# Patient Record
Sex: Female | Born: 1960 | Race: White | Hispanic: No | Marital: Single | Smoking: Former smoker
Health system: Southern US, Community
[De-identification: ages and names within clinical notes are randomized; demographics above are authoritative.]

## PROBLEM LIST (undated history)

## (undated) ENCOUNTER — Inpatient Hospital Stay (HOSPITAL_COMMUNITY)
Admission: RE | Payer: Medicaid Other | Source: Ambulatory Visit | Attending: Physician Assistant | Admitting: Physician Assistant

## (undated) DIAGNOSIS — F430 Acute stress reaction: Secondary | ICD-10-CM

## (undated) DIAGNOSIS — E039 Hypothyroidism, unspecified: Secondary | ICD-10-CM

## (undated) DIAGNOSIS — M5136 Other intervertebral disc degeneration, lumbar region: Secondary | ICD-10-CM

## (undated) DIAGNOSIS — K5900 Constipation, unspecified: Secondary | ICD-10-CM

## (undated) DIAGNOSIS — K760 Fatty (change of) liver, not elsewhere classified: Secondary | ICD-10-CM

## (undated) DIAGNOSIS — F4310 Post-traumatic stress disorder, unspecified: Secondary | ICD-10-CM

## (undated) DIAGNOSIS — K219 Gastro-esophageal reflux disease without esophagitis: Secondary | ICD-10-CM

## (undated) DIAGNOSIS — F329 Major depressive disorder, single episode, unspecified: Secondary | ICD-10-CM

## (undated) DIAGNOSIS — E079 Disorder of thyroid, unspecified: Secondary | ICD-10-CM

## (undated) DIAGNOSIS — F32A Depression, unspecified: Secondary | ICD-10-CM

## (undated) DIAGNOSIS — F209 Schizophrenia, unspecified: Secondary | ICD-10-CM

## (undated) DIAGNOSIS — F319 Bipolar disorder, unspecified: Secondary | ICD-10-CM

## (undated) DIAGNOSIS — G629 Polyneuropathy, unspecified: Secondary | ICD-10-CM

## (undated) DIAGNOSIS — F410 Panic disorder [episodic paroxysmal anxiety] without agoraphobia: Secondary | ICD-10-CM

## (undated) HISTORY — PX: COLONOSCOPY: SHX174

## (undated) HISTORY — PX: ESOPHAGOGASTRODUODENOSCOPY: SHX1529

## (undated) HISTORY — PX: SKIN CANCER EXCISION: SHX779

## (undated) HISTORY — PX: OTHER SURGICAL HISTORY: SHX169

## (undated) HISTORY — PX: MULTIPLE TOOTH EXTRACTIONS: SHX2053

---

## 1987-03-19 HISTORY — PX: AUGMENTATION MAMMAPLASTY: SUR837

## 2016-03-19 ENCOUNTER — Emergency Department (HOSPITAL_COMMUNITY)
Admission: EM | Admit: 2016-03-19 | Discharge: 2016-03-19 | Disposition: A | Discharge status: Home / Self Care | Payer: Medicaid Other | Attending: Emergency Medicine | Admitting: Emergency Medicine

## 2016-03-19 ENCOUNTER — Encounter (HOSPITAL_COMMUNITY): Payer: Self-pay

## 2016-03-19 ENCOUNTER — Emergency Department (HOSPITAL_COMMUNITY): Payer: Medicaid Other

## 2016-03-19 DIAGNOSIS — F17210 Nicotine dependence, cigarettes, uncomplicated: Secondary | ICD-10-CM | POA: Insufficient documentation

## 2016-03-19 DIAGNOSIS — M542 Cervicalgia: Secondary | ICD-10-CM | POA: Insufficient documentation

## 2016-03-19 DIAGNOSIS — M546 Pain in thoracic spine: Secondary | ICD-10-CM | POA: Diagnosis not present

## 2016-03-19 DIAGNOSIS — M549 Dorsalgia, unspecified: Secondary | ICD-10-CM

## 2016-03-19 HISTORY — DX: Disorder of thyroid, unspecified: E07.9

## 2016-03-19 HISTORY — DX: Depression, unspecified: F32.A

## 2016-03-19 HISTORY — DX: Major depressive disorder, single episode, unspecified: F32.9

## 2016-03-19 MED ORDER — OXYCODONE-ACETAMINOPHEN 5-325 MG PO TABS: 1.0000 | ORAL_TABLET | ORAL | 0 refills | Status: DC | PRN

## 2016-03-19 NOTE — ED Notes (Signed)
Patient complaining of feeling agitated, stating she has a panic disorder and having to wait in her small room is making her more agitated.  Patient given ice chips per her request and and therapeutic discussion of feelings, patient redirected and cooperative while still agitated at this time.

## 2016-03-19 NOTE — ED Notes (Signed)
Patient and visitors asking multiple questions regarding wait time and feeling anxious.  Trenton Founds, RN reassured patient and encouraged her to relax until ready for discharge.

## 2016-03-19 NOTE — Discharge Instructions (Signed)
Please take Percocet every 6 hours as needed for pain. Use warm compress for 15 minutes 3-5 times a day.  Get help right away if: You have shortness of breath. You have chest pain. You develop numbness or weakness in your legs. You have involuntary loss of urine (urinary incontinence).

## 2016-03-19 NOTE — ED Notes (Signed)
Patient given cup of water per request.

## 2016-03-19 NOTE — ED Notes (Signed)
Patient ambulated to restroom independently without difficulty. 

## 2016-03-19 NOTE — ED Notes (Signed)
Patient sitting in treatment room laughing at television with friend.

## 2016-03-19 NOTE — ED Provider Notes (Signed)
[facility] DEPT Provider Note   CSN: [id number] Arrival date & time: 03/19/16  1107  By signing my name below, I, Marilyn Hurley, attest that this documentation has been prepared under the direction and in the presence of Heath Lark, PA-C Electronically Signed: Soijett Hurley, ED Scribe. 03/19/16. 2:33 PM.  History   Chief Complaint Chief Complaint  Patient presents with  . Neck Pain    HPI Marilyn Hurley is a 56 y.o. female who presents to the Emergency Department complaining of 7.5/10 neck pain onset 2 days ago. Pt neck pain radiates to her right arm and bilateral upper back, right > left. Pt neck pain is worsened with movement. Pt is having associated symptoms of intermittent tingling to right arm. She has not tried any medications for the relief of her symptoms. She denies neck stiffness, HA, photophobia, right shoulder pain, color change, wound, gait problem, CP, SOB, fever, chills, abdominal pain, nausea, vomiting, diarrhea, constipation, dysuria, frequency, history of cancer, IVDU, and any other symptoms. Denies recent trauma or injury. Pt notes that she recently moved to [place] 1 month ago and she traveled via plane.   The history is provided by the patient. No language interpreter was used.    Past Medical History:  Diagnosis Date  . Depression   . Thyroid disease     There are no active problems to display for this patient.   Past Surgical History:  Procedure Laterality Date  . breast implant    . CESAREAN SECTION      OB History    No data available       Home Medications    Prior to Admission medications   Medication Sig Start Date End Date Taking? Authorizing Provider  oxyCODONE-acetaminophen (PERCOCET/ROXICET) 5-325 MG tablet Take 1-2 tablets by mouth every 4 (four) hours as needed for severe pain. 03/19/16   Jancarlos Thrun Mathews [place], [place]    Family History No family history on file.  Social History Social History  Substance Use Topics  . Smoking status:  Current Some Day Smoker    Types: Cigarettes  . Smokeless tobacco: Never Used  . Alcohol use No     Allergies   Ibuprofen and Keflex [cephalexin]   Review of Systems Review of Systems  Constitutional: Negative for chills and fever.  Eyes: Negative for photophobia.  Respiratory: Negative for shortness of breath.   Cardiovascular: Negative for chest pain.  Gastrointestinal: Negative for abdominal pain, diarrhea and vomiting.  Genitourinary: Negative for dysuria and frequency.  Musculoskeletal: Positive for neck pain (right sided). Negative for arthralgias and neck stiffness.  Skin: Negative for color change and wound.  Neurological: Negative for headaches.       Tingling to right arm    Physical Exam Updated Vital Signs BP 113/77 (BP Location: Left Arm)   Pulse 76   Temp 98.5 F (36.9 C) (Oral)   Resp 20   Ht 5\' 6"  (1.676 m)   Wt 84.8 kg   SpO2 98%   BMI 30.18 kg/m   Physical Exam  Constitutional: She is oriented to person, place, and time. She appears well-developed and well-nourished. No distress.  HENT:  Head: Normocephalic and atraumatic.  Eyes: EOM are normal. Pupils are equal, round, and reactive to light.  Neck: Normal range of motion. Neck supple.  Cardiovascular: Normal rate, regular rhythm, normal heart sounds and intact distal pulses.  Exam reveals no gallop and no friction rub.   No murmur heard. Pulmonary/Chest: Effort normal and breath sounds normal. No  respiratory distress. She has no wheezes. She has no rales.  Abdominal: Soft. She exhibits no distension and no pulsatile midline mass. There is no tenderness. There is no tenderness at McBurney's point and negative Murphy's sign.  Abdomen soft and non-tender. No pulsatile mass. No focal tenderness to McBurney's point. Negative Murphy's sign.   Musculoskeletal: Normal range of motion.       Cervical back: She exhibits tenderness, bony tenderness and edema.  Midline cervical and upper thoracic spine and  paraspinous muscles with TTP. Edema noted to midline lower cervical spine without erythema. No lumbar spinal or paraspinal tenderness. Full ROM of spine, with slight pain on twisting to right side.   Neurological: She is alert and oriented to person, place, and time. No sensory deficit. Coordination normal.  Cranial Nerves:  III,IV, VI: ptosis not present, extra-ocular movements intact bilaterally, direct and consensual pupillary light reflexes intact bilaterally V: facial sensation, jaw opening, and bite strength equal bilaterally VII: eyebrow raise, eyelid close, smile, frown, pucker equal bilaterally VIII: hearing grossly normal bilaterally  IX,X: palate elevation and swallowing intact XI: bilateral shoulder shrug and lateral head rotation equal and strong XII: midline tongue extension  Able to ambulate without difficulty.   Skin: Skin is warm and dry.  Psychiatric: She has a normal mood and affect. Her behavior is normal.  Nursing note and vitals reviewed.  ED Treatments / Results  DIAGNOSTIC STUDIES: Oxygen Saturation is 99% on RA, nl by my interpretation.    COORDINATION OF CARE: 2:24 PM Discussed treatment plan with pt at bedside which includes cervical spine xray, thoracic spine xray, and pt agreed to plan.   Radiology Dg Cervical Spine Complete  Result Date: 03/19/2016 CLINICAL DATA:  Upper back pain neck pain EXAM: CERVICAL SPINE - COMPLETE 4+ VIEW COMPARISON:  None. FINDINGS: Normal cervical alignment. Cervical kyphosis. Negative for fracture or mass. Mild disc degeneration and spurring at C5-6. No significant foraminal encroachment. Prevertebral soft tissues normal. IMPRESSION: Mild disc degeneration and spurring C5-6. Electronically Signed   By: Franchot Gallo M.D.   On: 03/19/2016 15:21   Dg Thoracic Spine 2 View  Result Date: 03/19/2016 CLINICAL DATA:  Upper back pain EXAM: THORACIC SPINE 2 VIEWS COMPARISON:  None. FINDINGS: Negative for fracture or mass. Normal alignment.  Mild thoracic disc degeneration at multiple levels. IMPRESSION: No acute abnormality. Electronically Signed   By: Franchot Gallo M.D.   On: 03/19/2016 15:35    Procedures Procedures (including critical care time)  Medications Ordered in ED Medications - No data to display   Initial Impression / Assessment and Plan / ED Course  I have reviewed the triage vital signs and the nursing notes.  Pertinent imaging results that were available during my care of the patient were reviewed by me and considered in my medical decision making (see chart for details).  Clinical Course   Patient is a 56 y.o. female who presents to the ED with upper back pain. No neurological deficits appreciated. Patient is ambulatory. No warning symptoms of back pain including: fecal incontinence, urinary retention or overflow incontinence, night sweats, waking from sleep with back pain, unexplained fevers or weight loss, h/o cancer, IVDU, recent trauma. No concern for cauda equina, epidural abscess, or other serious cause of back pain. Cervical spine xray shows mild degenerative disc changes and spurring at C5-6. Conservative measures such as rest, ice/heat and pain medicine indicated with PCP follow-up if no improvement with conservative management. Return precautions given for any new or worsening symptoms.  Final Clinical Impressions(s) / ED Diagnoses   Final diagnoses:  Upper back pain    New Prescriptions Discharge Medication List as of 03/19/2016  3:58 PM    START taking these medications   Details  oxyCODONE-acetaminophen (PERCOCET/ROXICET) 5-325 MG tablet Take 1-2 tablets by mouth every 4 (four) hours as needed for severe pain., Starting Tue 03/19/2016, Print       I personally performed the services described in this documentation, which was scribed in my presence. The recorded information has been reviewed and is accurate.    [place], [place] 03/19/16 1821    Veryl Speak, MD 03/19/16  2027

## 2016-03-19 NOTE — ED Triage Notes (Signed)
Pt presents for evaluation of neck pain with radiation down back x 2 days. Pt denies hx of neck/back problems. Pt ambulatory, denies injury to area. Pt AxO x4.

## 2016-03-19 NOTE — ED Notes (Signed)
Patient insists on leaving door open, currently has TV turned up loud, laughing and watching TV with visitors.  No needs voiced at this time.

## 2016-06-10 ENCOUNTER — Encounter (INDEPENDENT_AMBULATORY_CARE_PROVIDER_SITE_OTHER): Payer: Self-pay | Admitting: Physician Assistant

## 2016-06-10 ENCOUNTER — Ambulatory Visit (INDEPENDENT_AMBULATORY_CARE_PROVIDER_SITE_OTHER): Payer: Medicaid Other | Admitting: Physician Assistant

## 2016-06-10 VITALS — BP 126/77 | HR 77 | Temp 97.7°F | Ht 66.0 in | Wt 194.8 lb

## 2016-06-10 DIAGNOSIS — E039 Hypothyroidism, unspecified: Secondary | ICD-10-CM

## 2016-06-10 DIAGNOSIS — F418 Other specified anxiety disorders: Secondary | ICD-10-CM

## 2016-06-10 DIAGNOSIS — N899 Noninflammatory disorder of vagina, unspecified: Secondary | ICD-10-CM | POA: Diagnosis not present

## 2016-06-10 DIAGNOSIS — D179 Benign lipomatous neoplasm, unspecified: Secondary | ICD-10-CM | POA: Diagnosis not present

## 2016-06-10 DIAGNOSIS — Z760 Encounter for issue of repeat prescription: Secondary | ICD-10-CM

## 2016-06-10 DIAGNOSIS — M5020 Other cervical disc displacement, unspecified cervical region: Secondary | ICD-10-CM

## 2016-06-10 DIAGNOSIS — N898 Other specified noninflammatory disorders of vagina: Secondary | ICD-10-CM

## 2016-06-10 DIAGNOSIS — K5900 Constipation, unspecified: Secondary | ICD-10-CM

## 2016-06-10 MED ORDER — OMEPRAZOLE 20 MG PO CPDR: 20.0000 mg | DELAYED_RELEASE_CAPSULE | Freq: Every day | ORAL | 3 refills | Status: DC

## 2016-06-10 MED ORDER — ESCITALOPRAM OXALATE 10 MG PO TABS: 10.0000 mg | ORAL_TABLET | Freq: Every day | ORAL | 1 refills | Status: DC

## 2016-06-10 MED ORDER — SERTRALINE HCL 50 MG PO TABS: 50.0000 mg | ORAL_TABLET | Freq: Every day | ORAL | 2 refills | Status: DC

## 2016-06-10 MED ORDER — CLONAZEPAM 0.5 MG PO TABS: 0.5000 mg | ORAL_TABLET | Freq: Every day | ORAL | 0 refills | Status: DC

## 2016-06-10 MED ORDER — LEVOTHYROXINE SODIUM 150 MCG PO TABS: 150.0000 ug | ORAL_TABLET | Freq: Every day | ORAL | 3 refills | Status: AC

## 2016-06-10 MED ORDER — LEVOTHYROXINE SODIUM 150 MCG PO TABS: 150.0000 ug | ORAL_TABLET | Freq: Every day | ORAL | 3 refills | Status: DC

## 2016-06-10 NOTE — Progress Notes (Signed)
Subjective:  Patient ID: Marilyn Hurley, female    DOB: [birth year]  Age: 56 y.o. MRN: [id number]  CC:  Anxiety and multiple complaints  HPI Marilyn Hurley is a 56 y.o. female with a PMH of anxiety and hypothyroidism complains about depression and anxiety. Out of meds for approximately 2-3 weeks. Has not been able to establish psychiatric help here yet. Saw psychiatry in [place] for about 3 months in total. She feels uncomfortable around crowds, likes to stay home in her "safe place". Has trouble sleeping and was taking trazodone. Denies current suicidal ideation/intent or homicidal ideation/intent. She is also out of thyroid medication for 2 weeks. Was taking Synthroid 150 mcg but is now taking some left over 137 mcg. Has some epigastric pain that is relieved with prilosec OTC. Says she has a lump in the epigastrium (points to xiphoid process) that is concerning her. Said a previous doctor (described as the stupidest doctor in the world) told her the lump was part of her ribs. She is also concerned about a lump in the left flank. Lump was previously diagnosed as a lipoma. Lump is stable and non tender. Also shares that she has a lump that is located in the superior wall of the anterior vagina that makes it difficult for her to have sex. Would like a GYN referral. Lastly, says that she feels constipated and is having a bowel movement approximately every two days. Endorses no other symptoms.   Outpatient Medications Prior to Visit  Medication Sig Dispense Refill  . oxyCODONE-acetaminophen (PERCOCET/ROXICET) 5-325 MG tablet Take 1-2 tablets by mouth every 4 (four) hours as needed for severe pain. 10 tablet 0   No facility-administered medications prior to visit.      ROS Review of Systems  Constitutional: Negative for chills and fever.  Respiratory: Negative for shortness of breath.   Cardiovascular: Negative for chest pain and palpitations.  Gastrointestinal: Positive for abdominal pain  (epigastrium relieved with prilosec), constipation and heartburn. Negative for diarrhea, nausea and vomiting.  Skin: Negative for rash.  Neurological: Negative for headaches.  Psychiatric/Behavioral: Positive for depression. Negative for suicidal ideas. The patient is nervous/anxious and has insomnia.     Objective:  BP 126/77 (BP Location: Left Arm, Patient Position: Sitting, Cuff Size: Normal)   Pulse 77   Temp 97.7 F (36.5 C) (Oral)   Ht 5\' 6"  (1.676 m)   Wt 194 lb 12.8 oz (88.4 kg)   SpO2 97%   BMI 31.44 kg/m   BP/Weight 1/96/2229 09/24/8919  Systolic BP 194 174  Diastolic BP 77 77  Wt. (Lbs) 194.8 187  BMI 31.44 30.18      Physical Exam  Constitutional: She is oriented to person, place, and time.  Well developed, overweight, NAD, polite  HENT:  Head: Normocephalic and atraumatic.  Eyes: Pupils are equal, round, and reactive to light. No scleral icterus.  Neck: Normal range of motion. Neck supple. No thyromegaly present.  Cardiovascular: Normal rate, regular rhythm and normal heart sounds.   Pulmonary/Chest: Effort normal and breath sounds normal.  Abdominal: Soft. Bowel sounds are normal. She exhibits no distension and no mass. There is no tenderness. There is no rebound and no guarding.  Musculoskeletal: She exhibits no edema.  Lymphadenopathy:    She has no cervical adenopathy.  Neurological: She is alert and oriented to person, place, and time. A cranial nerve deficit is present. Coordination normal.  Skin: Skin is warm and dry. No rash noted. She is not diaphoretic. No erythema. No pallor.  Psychiatric: Her behavior is normal. Thought content normal.  Moderately severe anxiety  Vitals reviewed.    Assessment & Plan:   1. Depression with anxiety - PHQ9 score 24, GAD7 score 21 - Ambulatory referral to Psychiatry - Thyroid Panel With TSH - clonazePAM (KLONOPIN) 0.5 MG tablet; Take 1 tablet (0.5 mg total) by mouth at bedtime.  Dispense: 30 tablet; Refill: 0 -  escitalopram (LEXAPRO) 10 MG tablet; Take 1 tablet (10 mg total) by mouth daily.  Dispense: 30 tablet; Refill: 1  2. Hypothyroidism, unspecified type - CBC with Differential - Comprehensive metabolic panel - Thyroid Panel With TSH - levothyroxine (SYNTHROID, LEVOTHROID) 150 MCG tablet; Take 1 tablet (150 mcg total) by mouth daily.  Dispense: 90 tablet; Refill: 3  3. Vaginal mass - Suspected cystocele - Ambulatory referral to Gynecology  4. Lipoma, unspecified site - No treatment at this time. Lipoma reported as stable in size for many years and is causing no pain.  5. Constipation, unspecified constipation type - Increase fiber intake - Defecating at normal intervals, e.g. Every two days.  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  - Ambulatory referral to Orthopedic Surgery -Progress note A&P from outside clinic on 02/14/2016 summarizes MRI C4-5 disc herniation. Surgical intervention vs conservative management was discussed at the time. * Rest of the findings as follows:    1. CT C spine w/o contrast taken on 02/11/2016 shows no acute osseous abnormality, mild degenerative changes seen throughout the spine.     2. CT brain w/o contrast on 02/11/2016 shows no acute intracranial abnormalities.    3. MRA brain w/o IV contrast on 02/11/2016 shows unremarkable MRA of the brain.    4.MRI brain w/o contrast on 02/11/2016 shows no acute intracranial abnormalities  7. Medication refill - omeprazole (PRILOSEC) 20 MG capsule; Take 1 capsule (20 mg total) by mouth daily.  Dispense: 30 capsule; Refill: 3   Meds ordered this encounter  Medications  . DISCONTD: levothyroxine (SYNTHROID, LEVOTHROID) 150 MCG tablet    Sig: Take 1 tablet (150 mcg total) by mouth daily.    Dispense:  90 tablet    Refill:  3    Order Specific Question:   Supervising Provider    Answer:   Tresa Garter [id number]  . DISCONTD: omeprazole (PRILOSEC)  20 MG capsule    Sig: Take 1 capsule (20 mg total) by mouth daily.    Dispense:  30 capsule    Refill:  3    Order Specific Question:   Supervising Provider    Answer:   Tresa Garter [id number]  . DISCONTD: sertraline (ZOLOFT) 50 MG tablet    Sig: Take 1 tablet (50 mg total) by mouth daily.    Dispense:  30 tablet    Refill:  2    Order Specific Question:   Supervising Provider    Answer:   Tresa Garter [id number]  . clonazePAM (KLONOPIN) 0.5 MG tablet    Sig: Take 1 tablet (0.5 mg total) by mouth at bedtime.    Dispense:  30 tablet    Refill:  0    Order Specific Question:   Supervising Provider    Answer:   Tresa Garter [id number]  . levothyroxine (SYNTHROID, LEVOTHROID) 150 MCG tablet    Sig: Take 1 tablet (150 mcg total) by mouth daily.    Dispense:  90 tablet    Refill:  3    Order Specific Question:  Supervising Provider    Answer:   Tresa Garter [id number]  . omeprazole (PRILOSEC) 20 MG capsule    Sig: Take 1 capsule (20 mg total) by mouth daily.    Dispense:  30 capsule    Refill:  3    Order Specific Question:   Supervising Provider    Answer:   Tresa Garter [id number]  . DISCONTD: sertraline (ZOLOFT) 50 MG tablet    Sig: Take 1 tablet (50 mg total) by mouth daily.    Dispense:  30 tablet    Refill:  2    Order Specific Question:   Supervising Provider    Answer:   Tresa Garter [id number]  . escitalopram (LEXAPRO) 10 MG tablet    Sig: Take 1 tablet (10 mg total) by mouth daily.    Dispense:  30 tablet    Refill:  1    Order Specific Question:   Supervising Provider    Answer:   Tresa Garter [id number]    Follow-up: Return in about 4 weeks (around 07/08/2016) for anxiety.   Clent Demark PA

## 2016-06-10 NOTE — Patient Instructions (Signed)

## 2016-06-11 LAB — THYROID PANEL WITH TSH
FREE THYROXINE INDEX: 1 — AB (ref 1.2–4.9)
T3 Uptake Ratio: 13 % — ABNORMAL LOW (ref 24–39)
T4, Total: 7.8 ug/dL (ref 4.5–12.0)
TSH: 38.54 u[IU]/mL — ABNORMAL HIGH (ref 0.450–4.500)

## 2016-06-11 LAB — COMPREHENSIVE METABOLIC PANEL
ALBUMIN: 4.6 g/dL (ref 3.5–5.5)
ALT: 47 IU/L — ABNORMAL HIGH (ref 0–32)
AST: 41 IU/L — ABNORMAL HIGH (ref 0–40)
Albumin/Globulin Ratio: 1.2 (ref 1.2–2.2)
Alkaline Phosphatase: 79 IU/L (ref 39–117)
BILIRUBIN TOTAL: 0.3 mg/dL (ref 0.0–1.2)
BUN / CREAT RATIO: 17 (ref 9–23)
BUN: 12 mg/dL (ref 6–24)
CO2: 25 mmol/L (ref 18–29)
CREATININE: 0.72 mg/dL (ref 0.57–1.00)
Calcium: 9.4 mg/dL (ref 8.7–10.2)
Chloride: 101 mmol/L (ref 96–106)
GFR calc non Af Amer: 95 mL/min/{1.73_m2} (ref >59)
GFR, EST AFRICAN AMERICAN: 109 mL/min/{1.73_m2} (ref >59)
GLUCOSE: 84 mg/dL (ref 65–99)
Globulin, Total: 3.8 g/dL (ref 1.5–4.5)
Potassium: 4.7 mmol/L (ref 3.5–5.2)
Sodium: 140 mmol/L (ref 134–144)
TOTAL PROTEIN: 8.4 g/dL (ref 6.0–8.5)

## 2016-06-11 LAB — CBC WITH DIFFERENTIAL/PLATELET
BASOS ABS: 0 10*3/uL (ref 0.0–0.2)
Basos: 1 %
EOS (ABSOLUTE): 0.3 10*3/uL (ref 0.0–0.4)
EOS: 4 %
HEMOGLOBIN: 14.5 g/dL (ref 11.1–15.9)
Hematocrit: 41.8 % (ref 34.0–46.6)
IMMATURE GRANS (ABS): 0 10*3/uL (ref 0.0–0.1)
Immature Granulocytes: 0 %
LYMPHS: 41 %
Lymphocytes Absolute: 3.1 10*3/uL (ref 0.7–3.1)
MCH: 33.2 pg — AB (ref 26.6–33.0)
MCHC: 34.7 g/dL (ref 31.5–35.7)
MCV: 96 fL (ref 79–97)
MONOCYTES: 10 %
Monocytes Absolute: 0.8 10*3/uL (ref 0.1–0.9)
NEUTROS ABS: 3.4 10*3/uL (ref 1.4–7.0)
Neutrophils: 44 %
Platelets: 203 10*3/uL (ref 150–379)
RBC: 4.37 x10E6/uL (ref 3.77–5.28)
RDW: 13.5 % (ref 12.3–15.4)
WBC: 7.7 10*3/uL (ref 3.4–10.8)

## 2016-06-13 ENCOUNTER — Other Ambulatory Visit (INDEPENDENT_AMBULATORY_CARE_PROVIDER_SITE_OTHER): Payer: Self-pay | Admitting: Physician Assistant

## 2016-06-13 DIAGNOSIS — N898 Other specified noninflammatory disorders of vagina: Secondary | ICD-10-CM

## 2016-06-13 NOTE — Progress Notes (Signed)
Referral to GYN made to a different location.

## 2016-06-21 ENCOUNTER — Encounter: Payer: Self-pay | Admitting: Obstetrics & Gynecology

## 2016-06-21 ENCOUNTER — Ambulatory Visit (INDEPENDENT_AMBULATORY_CARE_PROVIDER_SITE_OTHER): Payer: Medicaid Other | Admitting: Obstetrics & Gynecology

## 2016-06-21 VITALS — BP 112/71 | HR 70 | Wt 191.3 lb

## 2016-06-21 DIAGNOSIS — N941 Unspecified dyspareunia: Secondary | ICD-10-CM | POA: Diagnosis present

## 2016-06-21 NOTE — Progress Notes (Signed)
   Subjective:    Patient ID: Marilyn Hurley, female    DOB: 09/03/1960, 56 y.o.   MRN: [id number]  HPI 56 yo engaged lady here because she noted a new sensation ("like a ball") with her finger in her vagina. She also describes dyspareunia for the last 3-4 months. It is not a size issue. She uses lubricants "sometimes".    Review of Systems Her pap is UTD, She sees her FP in 2 weeks and will discuss colonoscopy then. Mammogram UTD    Objective:   Physical Exam Somewhat nervous WFNAD Her vulva and vagina have V V A. She really tightens her introitus when I was examining her with one finger There is almost zero prolapse of any kind NO masses noted She does have several external skin tags.       Assessment & Plan:  dysparenua- rec Astroglide regularly, she declines vaginal estrogen I will be happy to remove her skin tags at her convenience

## 2016-06-21 NOTE — Progress Notes (Signed)
LAST PAP 2016

## 2016-07-08 ENCOUNTER — Ambulatory Visit (INDEPENDENT_AMBULATORY_CARE_PROVIDER_SITE_OTHER): Payer: Self-pay | Admitting: Orthopaedic Surgery

## 2016-07-08 ENCOUNTER — Encounter (INDEPENDENT_AMBULATORY_CARE_PROVIDER_SITE_OTHER): Payer: Self-pay | Admitting: Orthopaedic Surgery

## 2016-07-08 ENCOUNTER — Ambulatory Visit (INDEPENDENT_AMBULATORY_CARE_PROVIDER_SITE_OTHER): Payer: Medicaid Other | Admitting: Orthopaedic Surgery

## 2016-07-08 VITALS — BP 114/78 | HR 66 | Ht 65.0 in | Wt 181.0 lb

## 2016-07-08 DIAGNOSIS — M542 Cervicalgia: Secondary | ICD-10-CM | POA: Diagnosis not present

## 2016-07-09 NOTE — Progress Notes (Signed)
Office Visit Note   Patient: Marilyn Hurley           Date of Birth: 08/19/1960           MRN: [id number] Visit Date: 07/08/2016              Requested by: Clent Demark, PA-C [place], [place] [address] PCP: Clent Demark, PA-C   Assessment & Plan: Visit Diagnoses:  1. Neck pain     Plan: Patient reports severe pain and  had normal cervical  CT scan back in the fall 2017 in [place]. She rates her pain as severe. Will obtain an MRI scan cervical spine to evaluate her C5-6 spondylosis off was follow-up after row scan for review.  Follow-Up Instructions: No Follow-up on file.   Orders:  Orders Placed This Encounter  Procedures  . MR Cervical Spine w/o contrast   No orders of the defined types were placed in this encounter.     Procedures: No procedures performed   Clinical Data: No additional findings.   Subjective: Chief Complaint  Patient presents with  . Neck - Pain    HPI 56 year old female with very complex history. 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 Patient has been taking Percocet 10/325 4 tablets a day and states she has run out. She said plain radiograph series C-spine and T-spine 03/19/2016. CT scan 02/11/2016 in [place] at [facility] showed no acute osseous abnormalities. Plain radiographs and  showed mild disc degeneration spurring at C5-6. Patient complains of pain of pain in the posterior cervical neck and radiates down and to the upper thoracic spine. T-spine x-rays were normal. Patient denies any associated bowel or bladder symptoms.  Review of Systems patient has history of acid reflux anxiety depression sleep apnea thyroid condition. The right sided body paralysis with no evidence of stroke. Neck pain with decreased range of motion with pain that radiates from her neck and her right  shoulder.   Objective: Vital Signs: BP 114/78   Pulse 66   Ht 5\' 5"  (1.651 m)   Wt 181 lb (82.1 kg)   BMI 30.12 kg/m   Physical Exam  Constitutional: She is oriented to person, place, and time. She appears well-developed.  HENT:  Head: Normocephalic.  Right Ear: External ear normal.  Left Ear: External ear normal.  Eyes: Pupils are equal, round, and reactive to light.  Neck: No tracheal deviation present. No thyromegaly present.  Cardiovascular: Normal rate.   Pulmonary/Chest: Effort normal.  Abdominal: Soft.  Musculoskeletal:  Patient has increased pain with cervical compression some improvement with the cervical distraction. She has brachial plexus tenderness in the right positive Spurling on the right knee over many upper extremity reflexes are 2+. No spasticity no atrophy no isolated motor weakness. No lower extremity hyperreflexia. She has pain with palpation of the trapezial muscle on the right than left. Tenderness of the C7-T1 spinous processes. No winging of the scapula serratus anterior is normal. Normal shoulders shrug. Nodes of the ulnar nerve compression of the elbow median nerve forearm. No shoulder subluxation. Rotator cuff testing is normal.  Neurological: She is alert and oriented to person, place, and time.  Skin: Skin is warm and dry.  Psychiatric: She has a normal mood and affect. Her behavior is normal.    Ortho Exam  Specialty Comments:  No specialty  comments available.  Imaging: No results found.   PMFS History: There are no active problems to display for this patient.  Past Medical History:  Diagnosis Date  . Depression   . Thyroid disease     No family history on file.  Past Surgical History:  Procedure Laterality Date  . breast implant    . CESAREAN SECTION     Social History   Occupational History  . Not on file.   Social History Main Topics  . Smoking status: Current Some Day Smoker    Types: Cigarettes  . Smokeless tobacco:  Never Used  . Alcohol use No  . Drug use: No  . Sexual activity: Not on file

## 2016-07-11 ENCOUNTER — Ambulatory Visit (INDEPENDENT_AMBULATORY_CARE_PROVIDER_SITE_OTHER): Payer: Medicaid Other | Admitting: Physician Assistant

## 2016-07-11 ENCOUNTER — Encounter (INDEPENDENT_AMBULATORY_CARE_PROVIDER_SITE_OTHER): Payer: Self-pay | Admitting: Physician Assistant

## 2016-07-11 VITALS — BP 123/84 | HR 70 | Temp 98.4°F | Wt 186.4 lb

## 2016-07-11 DIAGNOSIS — F411 Generalized anxiety disorder: Secondary | ICD-10-CM | POA: Insufficient documentation

## 2016-07-11 DIAGNOSIS — F410 Panic disorder [episodic paroxysmal anxiety] without agoraphobia: Secondary | ICD-10-CM

## 2016-07-11 DIAGNOSIS — F209 Schizophrenia, unspecified: Secondary | ICD-10-CM

## 2016-07-11 DIAGNOSIS — F319 Bipolar disorder, unspecified: Secondary | ICD-10-CM | POA: Diagnosis not present

## 2016-07-11 MED ORDER — CLONAZEPAM 1 MG PO TABS: 1.0000 mg | ORAL_TABLET | Freq: Two times a day (BID) | ORAL | 0 refills | Status: DC

## 2016-07-11 NOTE — Progress Notes (Signed)
Subjective:  Patient ID: Marilyn Hurley, female    DOB: 05-16-1960  Age: 56 y.o. MRN: [id number]  CC: f/u panic attacks  HPI Marilyn Hurley is a 56 y.o. female with a PMH of Hypothryoidism, depression, and anxiety that presents today for f/u of anxiety. States she has been taking clonazepam and Lexapro as directed with no reduction in anxiety or insomnia. Feels panic attacks are triggered by certain things such as when watching violent shows. Went to see a psychologist one week ago and was told she has schizophrenia and bipolar disorder. She was then referred to a psychiatrist. Her appointment is in 4 days with psychiatry. Would like to have her anxiety controlled by the time she goes to [place] next Tuesday. Does not endorse any other symptoms.  Outpatient Medications Prior to Visit  Medication Sig Dispense Refill  . escitalopram (LEXAPRO) 10 MG tablet Take 1 tablet (10 mg total) by mouth daily. 30 tablet 1  . levothyroxine (SYNTHROID, LEVOTHROID) 150 MCG tablet Take 1 tablet (150 mcg total) by mouth daily. 90 tablet 3  . omeprazole (PRILOSEC) 20 MG capsule Take 1 capsule (20 mg total) by mouth daily. 30 capsule 3  . clonazePAM (KLONOPIN) 0.5 MG tablet Take 1 tablet (0.5 mg total) by mouth at bedtime. 30 tablet 0   No facility-administered medications prior to visit.      ROS Review of Systems  Constitutional: Negative for chills, fever and malaise/fatigue.  Eyes: Negative for blurred vision.  Respiratory: Negative for shortness of breath.   Cardiovascular: Negative for chest pain and palpitations.  Gastrointestinal: Negative for abdominal pain and nausea.  Genitourinary: Negative for dysuria and hematuria.  Musculoskeletal: Negative for joint pain and myalgias.  Skin: Negative for rash.  Neurological: Negative for tingling and headaches.  Psychiatric/Behavioral: Negative for depression. The patient is nervous/anxious.     Objective:  BP 123/84 (BP Location: Left Arm, Patient  Position: Sitting, Cuff Size: Normal)   Pulse 70   Temp 98.4 F (36.9 C) (Oral)   Wt 186 lb 6.4 oz (84.6 kg)   SpO2 97%   BMI 31.02 kg/m   BP/Weight 07/11/2016 07/19/5463 08/23/1273  Systolic BP 170 017 494  Diastolic BP 84 78 71  Wt. (Lbs) 186.4 181 191.3  BMI 31.02 30.12 30.88      Physical Exam  Constitutional: She is oriented to person, place, and time.  Well developed, well nourished, NAD, polite  HENT:  Head: Normocephalic and atraumatic.  Eyes: No scleral icterus.  Neck: Normal range of motion. Neck supple. No thyromegaly present.  Cardiovascular: Normal rate, regular rhythm and normal heart sounds.   Pulmonary/Chest: Effort normal and breath sounds normal.  Abdominal: Soft. Bowel sounds are normal. There is no tenderness.  Musculoskeletal: She exhibits no edema.  Neurological: She is alert and oriented to person, place, and time.  Skin: Skin is warm and dry. No rash noted. No erythema. No pallor.  Psychiatric: Her behavior is normal. Thought content normal.  Very anxious, jittery. Thoughts nontangential, judgment poor, insight poor. No rumination, pressured speech. Not delusional. Not depressed.  Vitals reviewed.    Assessment & Plan:   1. Panic attack - clonazePAM (KLONOPIN) 1 MG tablet; Take 1 tablet (1 mg total) by mouth 2 (two) times daily.  Dispense: 30 tablet; Refill: 0  2. Schizophrenia, unspecified type [facility]) - Per patient after psychological evaluation. No note to review.  3. Bipolar affective disorder, remission status unspecified ([place]) -  Per patient after psychological evaluation. No note to review.  Meds ordered this encounter  Medications  . clonazePAM (KLONOPIN) 1 MG tablet    Sig: Take 1 tablet (1 mg total) by mouth 2 (two) times daily.    Dispense:  30 tablet    Refill:  0    Order Specific Question:   Supervising Provider    Answer:   Tresa Garter [id number]    Follow-up: Return in about 6 weeks (around 08/22/2016) for thyroid and  LFTs.   Clent Demark PA

## 2016-07-11 NOTE — Patient Instructions (Signed)
Please discuss further treatment of Bipolar disorder and Schizophrenia with your Psychiatrist on Monday. Return here if you are unable to see your psychiatrist.  Living With Anxiety After being diagnosed with an anxiety disorder, you may be relieved to know why you have felt or behaved a certain way. It is natural to also feel overwhelmed about the treatment ahead and what it will mean for your life. With care and support, you can manage this condition and recover from it. How to cope with anxiety Dealing with stress  Stress is your body's reaction to life changes and events, both good and bad. Stress can last just a few hours or it can be ongoing. Stress can play a major role in anxiety, so it is important to learn both how to cope with stress and how to think about it differently. Talk with your health care provider or a counselor to learn more about stress reduction. He or she may suggest some stress reduction techniques, such as:  Music therapy. This can include creating or listening to music that you enjoy and that inspires you.  Mindfulness-based meditation. This involves being aware of your normal breaths, rather than trying to control your breathing. It can be done while sitting or walking.  Centering prayer. This is a kind of meditation that involves focusing on a word, phrase, or sacred image that is meaningful to you and that brings you peace.  Deep breathing. To do this, expand your stomach and inhale slowly through your nose. Hold your breath for 3-5 seconds. Then exhale slowly, allowing your stomach muscles to relax.  Self-talk. This is a skill where you identify thought patterns that lead to anxiety reactions and correct those thoughts.  Muscle relaxation. This involves tensing muscles then relaxing them. Choose a stress reduction technique that fits your lifestyle and personality. Stress reduction techniques take time and practice. Set aside 5-15 minutes a day to do them.  Therapists can offer training in these techniques. The training may be covered by some insurance plans. Other things you can do to manage stress include:  Keeping a stress diary. This can help you learn what triggers your stress and ways to control your response.  Thinking about how you respond to certain situations. You may not be able to control everything, but you can control your reaction.  Making time for activities that help you relax, and not feeling guilty about spending your time in this way. Therapy combined with coping and stress-reduction skills provides the best chance for successful treatment. Medicines  Medicines can help ease symptoms. Medicines for anxiety include:  Anti-anxiety drugs.  Antidepressants.  Beta-blockers. Medicines may be used as the main treatment for anxiety disorder, along with therapy, or if other treatments are not working. Medicines should be prescribed by a health care provider. Relationships  Relationships can play a big part in helping you recover. Try to spend more time connecting with trusted friends and family members. Consider going to couples counseling, taking family education classes, or going to family therapy. Therapy can help you and others better understand the condition. How to recognize changes in your condition Everyone has a different response to treatment for anxiety. Recovery from anxiety happens when symptoms decrease and stop interfering with your daily activities at home or work. This may mean that you will start to:  Have better concentration and focus.  Sleep better.  Be less irritable.  Have more energy.  Have improved memory. It is important to recognize when your condition  is getting worse. Contact your health care provider if your symptoms interfere with home or work and you do not feel like your condition is improving. Where to find help and support: You can get help and support from these sources:  Self-help  groups.  Online and [employer].  A trusted spiritual leader.  Couples counseling.  Family education classes.  Family therapy. Follow these instructions at home:  Eat a healthy diet that includes plenty of vegetables, fruits, whole grains, low-fat dairy products, and lean protein. Do not eat a lot of foods that are high in solid fats, added sugars, or salt.  Exercise. Most adults should do the following:  Exercise for at least 150 minutes each week. The exercise should increase your heart rate and make you sweat (moderate-intensity exercise).  Strengthening exercises at least twice a week.  Cut down on caffeine, tobacco, alcohol, and other potentially harmful substances.  Get the right amount and quality of sleep. Most adults need 7-9 hours of sleep each night.  Make choices that simplify your life.  Take over-the-counter and prescription medicines only as told by your health care provider.  Avoid caffeine, alcohol, and certain over-the-counter cold medicines. These may make you feel worse. Ask your pharmacist which medicines to avoid.  Keep all follow-up visits as told by your health care provider. This is important. Questions to ask your health care provider  Would I benefit from therapy?  How often should I follow up with a health care provider?  How long do I need to take medicine?  Are there any long-term side effects of my medicine?  Are there any alternatives to taking medicine? Contact a health care provider if:  You have a hard time staying focused or finishing daily tasks.  You spend many hours a day feeling worried about everyday life.  You become exhausted by worry.  You start to have headaches, feel tense, or have nausea.  You urinate more than normal.  You have diarrhea. Get help right away if:  You have a racing heart and shortness of breath.  You have thoughts of hurting yourself or others. If you ever feel like you may hurt  yourself or others, or have thoughts about taking your own life, get help right away. You can go to your nearest emergency department or call:  Your local emergency services (911 in the [place]  A suicide crisis helpline, such as the [facility] at 709-271-3437. This is open 24-hours a day. Summary  Taking steps to deal with stress can help calm you.  Medicines cannot cure anxiety disorders, but they can help ease symptoms.  Family, friends, and partners can play a big part in helping you recover from an anxiety disorder. This information is not intended to replace advice given to you by your health care provider. Make sure you discuss any questions you have with your health care provider. Document Released: 02/27/2016 Document Revised: 02/27/2016 Document Reviewed: 02/27/2016 Elsevier Interactive Patient Education  2017 [employer].

## 2016-07-15 ENCOUNTER — Other Ambulatory Visit (INDEPENDENT_AMBULATORY_CARE_PROVIDER_SITE_OTHER): Payer: Self-pay | Admitting: Physician Assistant

## 2016-07-15 DIAGNOSIS — F411 Generalized anxiety disorder: Secondary | ICD-10-CM

## 2016-07-15 DIAGNOSIS — F209 Schizophrenia, unspecified: Secondary | ICD-10-CM

## 2016-07-15 MED ORDER — ALPRAZOLAM 0.5 MG PO TABS: 0.5000 mg | ORAL_TABLET | Freq: Every evening | ORAL | 0 refills | Status: DC | PRN

## 2016-07-15 MED ORDER — QUETIAPINE FUMARATE 50 MG PO TABS: ORAL_TABLET | ORAL | 1 refills | Status: DC

## 2016-07-15 MED ORDER — QUETIAPINE FUMARATE 50 MG PO TABS: 50.0000 mg | ORAL_TABLET | ORAL | 1 refills | Status: DC

## 2016-07-15 NOTE — Progress Notes (Signed)
I had to change sig on Seroquel to "use free text".

## 2016-07-15 NOTE — Progress Notes (Signed)
Patient called and updated me.  Pt could not establish treatment with psychiatrist. Says that psychiatrist could not access her "assessment file" and told her to have me prescribe medication. Clonazepam failed. I Will downtitrate benzo to elimination. Will treat suspected paranoid schizophrenia with Seroquel. Will need to follow up with patient after her trip to [place]. I told patient to come and pick up Rx so I may explain medications and plan to her.

## 2016-07-17 ENCOUNTER — Ambulatory Visit (HOSPITAL_COMMUNITY): Payer: Medicaid Other

## 2016-07-23 ENCOUNTER — Ambulatory Visit (INDEPENDENT_AMBULATORY_CARE_PROVIDER_SITE_OTHER): Payer: Self-pay | Admitting: Orthopaedic Surgery

## 2016-09-23 ENCOUNTER — Telehealth (INDEPENDENT_AMBULATORY_CARE_PROVIDER_SITE_OTHER): Payer: Self-pay | Admitting: *Deleted

## 2016-09-23 ENCOUNTER — Other Ambulatory Visit (INDEPENDENT_AMBULATORY_CARE_PROVIDER_SITE_OTHER): Payer: Self-pay | Admitting: Orthopaedic Surgery

## 2016-09-23 DIAGNOSIS — M542 Cervicalgia: Secondary | ICD-10-CM

## 2016-09-23 NOTE — Telephone Encounter (Signed)
Pt called stating she was back in town and wanted to schedule her MRI now.

## 2016-09-23 NOTE — Telephone Encounter (Signed)
I called pt back and advised her can call imaging place to Reschedule MRI

## 2016-09-24 ENCOUNTER — Ambulatory Visit (INDEPENDENT_AMBULATORY_CARE_PROVIDER_SITE_OTHER): Payer: Medicaid Other | Admitting: Internal Medicine

## 2016-09-24 ENCOUNTER — Encounter (INDEPENDENT_AMBULATORY_CARE_PROVIDER_SITE_OTHER): Payer: Self-pay | Admitting: Internal Medicine

## 2016-09-24 ENCOUNTER — Telehealth (INDEPENDENT_AMBULATORY_CARE_PROVIDER_SITE_OTHER): Payer: Self-pay | Admitting: Physician Assistant

## 2016-09-24 VITALS — BP 121/85 | HR 80 | Temp 97.6°F | Wt 193.6 lb

## 2016-09-24 DIAGNOSIS — R200 Anesthesia of skin: Secondary | ICD-10-CM | POA: Diagnosis not present

## 2016-09-24 DIAGNOSIS — E039 Hypothyroidism, unspecified: Secondary | ICD-10-CM | POA: Diagnosis not present

## 2016-09-24 DIAGNOSIS — R202 Paresthesia of skin: Secondary | ICD-10-CM | POA: Diagnosis not present

## 2016-09-24 DIAGNOSIS — R740 Nonspecific elevation of levels of transaminase and lactic acid dehydrogenase [LDH]: Secondary | ICD-10-CM | POA: Diagnosis not present

## 2016-09-24 DIAGNOSIS — Z1239 Encounter for other screening for malignant neoplasm of breast: Secondary | ICD-10-CM

## 2016-09-24 DIAGNOSIS — Z1211 Encounter for screening for malignant neoplasm of colon: Secondary | ICD-10-CM

## 2016-09-24 DIAGNOSIS — R7401 Elevation of levels of liver transaminase levels: Secondary | ICD-10-CM | POA: Insufficient documentation

## 2016-09-24 DIAGNOSIS — F410 Panic disorder [episodic paroxysmal anxiety] without agoraphobia: Secondary | ICD-10-CM

## 2016-09-24 DIAGNOSIS — Z1231 Encounter for screening mammogram for malignant neoplasm of breast: Secondary | ICD-10-CM

## 2016-09-24 MED ORDER — CLONAZEPAM 0.5 MG PO TABS: 0.5000 mg | ORAL_TABLET | Freq: Every day | ORAL | 0 refills | Status: DC

## 2016-09-24 MED ORDER — EPINEPHRINE 0.3 MG/0.3ML IJ SOAJ: 0.3000 mg | Freq: Once | INTRAMUSCULAR | 1 refills | Status: AC

## 2016-09-24 MED ORDER — GABAPENTIN 300 MG PO CAPS: 300.0000 mg | ORAL_CAPSULE | Freq: Three times a day (TID) | ORAL | 3 refills | Status: DC

## 2016-09-24 MED ORDER — LACTULOSE 20 GM/30ML PO SOLN: 20.0000 g | Freq: Every day | ORAL | 3 refills | Status: DC | PRN

## 2016-09-24 NOTE — Patient Instructions (Signed)
Generalized Anxiety Disorder, Adult Generalized anxiety disorder (GAD) is a mental health disorder. People with this condition constantly worry about everyday events. Unlike normal anxiety, worry related to GAD is not triggered by a specific event. These worries also do not fade or get better with time. GAD interferes with life functions, including relationships, work, and school. GAD can vary from mild to severe. People with severe GAD can have intense waves of anxiety with physical symptoms (panic attacks). What are the causes? The exact cause of GAD is not known. What increases the risk? This condition is more likely to develop in:  Women.  People who have a family history of anxiety disorders.  People who are very shy.  People who experience very stressful life events, such as the death of a loved one.  People who have a very stressful family environment.  What are the signs or symptoms? People with GAD often worry excessively about many things in their lives, such as their health and family. They may also be overly concerned about:  Doing well at work.  Being on time.  Natural disasters.  Friendships.  Physical symptoms of GAD include:  Fatigue.  Muscle tension or having muscle twitches.  Trembling or feeling shaky.  Being easily startled.  Feeling like your heart is pounding or racing.  Feeling out of breath or like you cannot take a deep breath.  Having trouble falling asleep or staying asleep.  Sweating.  Nausea, diarrhea, or irritable bowel syndrome (IBS).  Headaches.  Trouble concentrating or remembering facts.  Restlessness.  Irritability.  How is this diagnosed? Your health care provider can diagnose GAD based on your symptoms and medical history. You will also have a physical exam. The health care provider will ask specific questions about your symptoms, including how severe they are, when they started, and if they come and go. Your health care  provider may ask you about your use of alcohol or drugs, including prescription medicines. Your health care provider may refer you to a mental health specialist for further evaluation. Your health care provider will do a thorough examination and may perform additional tests to rule out other possible causes of your symptoms. To be diagnosed with GAD, a person must have anxiety that:  Is out of his or her control.  Affects several different aspects of his or her life, such as work and relationships.  Causes distress that makes him or her unable to take part in normal activities.  Includes at least three physical symptoms of GAD, such as restlessness, fatigue, trouble concentrating, irritability, muscle tension, or sleep problems.  Before your health care provider can confirm a diagnosis of GAD, these symptoms must be present more days than they are not, and they must last for six months or longer. How is this treated? The following therapies are usually used to treat GAD:  Medicine. Antidepressant medicine is usually prescribed for long-term daily control. Antianxiety medicines may be added in severe cases, especially when panic attacks occur.  Talk therapy (psychotherapy). Certain types of talk therapy can be helpful in treating GAD by providing support, education, and guidance. Options include: ? Cognitive behavioral therapy (CBT). People learn coping skills and techniques to ease their anxiety. They learn to identify unrealistic or negative thoughts and behaviors and to replace them with positive ones. ? Acceptance and commitment therapy (ACT). This treatment teaches people how to be mindful as a way to cope with unwanted thoughts and feelings. ? Biofeedback. This process trains you to   manage your body's response (physiological response) through breathing techniques and relaxation methods. You will work with a therapist while machines are used to monitor your physical symptoms.  Stress  management techniques. These include yoga, meditation, and exercise.  A mental health specialist can help determine which treatment is best for you. Some people see improvement with one type of therapy. However, other people require a combination of therapies. Follow these instructions at home:  Take over-the-counter and prescription medicines only as told by your health care provider.  Try to maintain a normal routine.  Try to anticipate stressful situations and allow extra time to manage them.  Practice any stress management or self-calming techniques as taught by your health care provider.  Do not punish yourself for setbacks or for not making progress.  Try to recognize your accomplishments, even if they are small.  Keep all follow-up visits as told by your health care provider. This is important. Contact a health care provider if:  Your symptoms do not get better.  Your symptoms get worse.  You have signs of depression, such as: ? A persistently sad, cranky, or irritable mood. ? Loss of enjoyment in activities that used to bring you joy. ? Change in weight or eating. ? Changes in sleeping habits. ? Avoiding friends or family members. ? Loss of energy for normal tasks. ? Feelings of guilt or worthlessness. Get help right away if:  You have serious thoughts about hurting yourself or others. If you ever feel like you may hurt yourself or others, or have thoughts about taking your own life, get help right away. You can go to your nearest emergency department or call:  Your local emergency services (911 in the [place]  A suicide crisis helpline, such as the [place] at (725)726-3052. This is open 24 hours a day.  Summary  Generalized anxiety disorder (GAD) is a mental health disorder that involves worry that is not triggered by a specific event.  People with GAD often worry excessively about many things in their lives, such as their health and  family.  GAD may cause physical symptoms such as restlessness, trouble concentrating, sleep problems, frequent sweating, nausea, diarrhea, headaches, and trembling or muscle twitching.  A mental health specialist can help determine which treatment is best for you. Some people see improvement with one type of therapy. However, other people require a combination of therapies. This information is not intended to replace advice given to you by your health care provider. Make sure you discuss any questions you have with your health care provider. Document Released: 06/29/2012 Document Revised: 01/23/2016 Document Reviewed: 01/23/2016 Elsevier Interactive Patient Education  2018 [employer]. Hypothyroidism Hypothyroidism is a disorder of the thyroid. The thyroid is a large gland that is located in the lower front of the neck. The thyroid releases hormones that control how the body works. With hypothyroidism, the thyroid does not make enough of these hormones. What are the causes? Causes of hypothyroidism may include:  Viral infections.  Pregnancy.  Your own defense system (immune system) attacking your thyroid.  Certain medicines.  Birth defects.  Past radiation treatments to your head or neck.  Past treatment with radioactive iodine.  Past surgical removal of part or all of your thyroid.  Problems with the gland that is located in the center of your brain (pituitary).  What are the signs or symptoms? Signs and symptoms of hypothyroidism may include:  Feeling as though you have no energy (lethargy).  Inability to  tolerate cold.  Weight gain that is not explained by a change in diet or exercise habits.  Dry skin.  Coarse hair.  Menstrual irregularity.  Slowing of thought processes.  Constipation.  Sadness or depression.  How is this diagnosed? Your health care provider may diagnose hypothyroidism with blood tests and ultrasound tests. How is this  treated? Hypothyroidism is treated with medicine that replaces the hormones that your body does not make. After you begin treatment, it may take several weeks for symptoms to go away. Follow these instructions at home:  Take medicines only as directed by your health care provider.  If you start taking any new medicines, tell your health care provider.  Keep all follow-up visits as directed by your health care provider. This is important. As your condition improves, your dosage needs may change. You will need to have blood tests regularly so that your health care provider can watch your condition. Contact a health care provider if:  Your symptoms do not get better with treatment.  You are taking thyroid replacement medicine and: ? You sweat excessively. ? You have tremors. ? You feel anxious. ? You lose weight rapidly. ? You cannot tolerate heat. ? You have emotional swings. ? You have diarrhea. ? You feel weak. Get help right away if:  You develop chest pain.  You develop an irregular heartbeat.  You develop a rapid heartbeat. This information is not intended to replace advice given to you by your health care provider. Make sure you discuss any questions you have with your health care provider. Document Released: 03/04/2005 Document Revised: 08/10/2015 Document Reviewed: 07/20/2013 Elsevier Interactive Patient Education  2017 [employer].

## 2016-09-24 NOTE — Progress Notes (Signed)
Marilyn Hurley, is a 56 y.o. female  WJX:[id number]  OZH:[id number]  DOB - Aug 12, 1960  Chief Complaint  Patient presents with  . Follow-up    Thyroid and LFT's  . Medication Refill    Xanax       Subjective:   Marilyn Hurley is a 56 y.o. female with a medical history of Hypothryoidism, depression, and anxiety presents here today for a follow up visit of anxiety. Patient a little pushy on her demand for Xanax. When told Xanax is for short duration of use and if she needs something long term, we will give her Klonopin, she agreed but also dictating the dosage and frequency of use. She said she was just given Xanax during her last visit and it worked well. She has not seen psychiatrist or behavioral therapist. Patient has No headache, No chest pain, No abdominal pain - No Nausea, No new weakness tingling or numbness, No Cough - SOB. She is due for mammogram and colonoscopy. She has history of elevated liver enzymes, need follow up.   Problem  Panic Attack  Hypothyroidism  Transaminitis  Colon Cancer Screening  Breast Cancer Screening    ALLERGIES: Allergies  Allergen Reactions  . Ibuprofen   . Keflex [Cephalexin]     PAST MEDICAL HISTORY: Past Medical History:  Diagnosis Date  . Depression   . Thyroid disease     MEDICATIONS AT HOME: Prior to Admission medications   Medication Sig Start Date End Date Taking? Authorizing Provider  clonazePAM (KLONOPIN) 0.5 MG tablet Take 1 tablet (0.5 mg total) by mouth daily. 09/24/16   Tresa Garter, MD  EPINEPHrine 0.3 mg/0.3 mL IJ SOAJ injection Inject 0.3 mLs (0.3 mg total) into the muscle once. 09/24/16 09/24/16  Tresa Garter, MD  Lactulose 20 GM/30ML SOLN Take 30 mLs (20 g total) by mouth daily as needed. 09/24/16   Tresa Garter, MD  levothyroxine (SYNTHROID, LEVOTHROID) 150 MCG tablet Take 1 tablet (150 mcg total) by mouth daily. 06/10/16   Clent Demark, PA-C  omeprazole (PRILOSEC) 20 MG capsule Take 1  capsule (20 mg total) by mouth daily. 06/10/16   Clent Demark, PA-C  QUEtiapine (SEROQUEL) 50 MG tablet Half tab BID x2 days. Then one pill BID for two days. Then one pill TID thereafter. 07/15/16   Clent Demark, PA-C    Objective:   Vitals:   09/24/16 0904  BP: 121/85  Pulse: 80  Temp: 97.6 F (36.4 C)  TempSrc: Oral  SpO2: 96%  Weight: 193 lb 9.6 oz (87.8 kg)   Exam General appearance : Awake, alert, not in any distress. Speech Clear. Not toxic looking, obese, adentulous HEENT: Atraumatic and Normocephalic, pupils equally reactive to light and accomodation Neck: Supple, no JVD. No cervical lymphadenopathy.  Chest: Good air entry bilaterally, no added sounds  CVS: S1 S2 regular, no murmurs.  Abdomen: Bowel sounds present, Non tender and not distended with no gaurding, rigidity or rebound. Extremities: B/L Lower Ext shows no edema, both legs are warm to touch Neurology: Awake alert, and oriented X 3, CN II-XII intact, Non focal Skin: No Rash  Data Review No results found for: HGBA1C  Assessment & Plan   1. Panic attack  - Clonazepam 0.5 Mg PO daily prn, will increase dose as appropriate - Patient educated about our opiate and benzo prescription policy, we do not prescribe Xanax on a long term basis, need to get from Psychiatrist  2. Hypothyroidism, unspecified type  - TSH -  T4, Free - Continue current medications  3. Transaminitis  - Hepatic Function Panel  4. Colon cancer screening  - Ambulatory referral to Gastroenterology  5. Breast cancer screening  - MM Digital Screening; Future  Patient have been counseled extensively about nutrition and exercise. Other issues discussed during this visit include: low cholesterol diet, weight control and daily exercise, importance of adherence with medications and regular follow-up.   Return in about 4 weeks (around 10/22/2016) for Generalized Anxiety Disorder.  The patient was given clear instructions to go to  ER or return to medical center if symptoms don't improve, worsen or new problems develop. The patient verbalized understanding. The patient was told to call to get lab results if they haven't heard anything in the next week.   This note has been created with Surveyor, quantity. Any transcriptional errors are unintentional.    Angelica Chessman, MD, [place], Karilyn Cota, [place] and [place], [place]   09/24/2016, 9:56 AM

## 2016-09-24 NOTE — Telephone Encounter (Signed)
Patient called stated   Rx for Klonopin is suppose to be for 1 mg.  And Dr gave her .5 mg tab.   Requesting Rx for Gabapentin  . Please send RXs to [facility] on [address] ph: [phone]   Please follow up with patient.

## 2016-09-24 NOTE — Telephone Encounter (Signed)
FWD to [facility] covering Provider. Nat Christen, CMA

## 2016-09-30 ENCOUNTER — Other Ambulatory Visit (INDEPENDENT_AMBULATORY_CARE_PROVIDER_SITE_OTHER): Payer: Self-pay | Admitting: Physician Assistant

## 2016-09-30 DIAGNOSIS — F209 Schizophrenia, unspecified: Secondary | ICD-10-CM

## 2016-09-30 NOTE — Telephone Encounter (Signed)
FWD to PCP. Brady Schiller S Daniell Paradise, CMA  

## 2016-10-01 ENCOUNTER — Telehealth (INDEPENDENT_AMBULATORY_CARE_PROVIDER_SITE_OTHER): Payer: Self-pay | Admitting: *Deleted

## 2016-10-01 NOTE — Telephone Encounter (Signed)
Pt is scheduled for MRI on Tues July 24th at 2:00pm, pt is to arrive 15 mins early to register, lmtrc to pt for appt information.

## 2016-10-02 NOTE — Telephone Encounter (Signed)
Pt aware of appt scheduled at [facility] MRI and follow up has been made.

## 2016-10-08 ENCOUNTER — Other Ambulatory Visit (INDEPENDENT_AMBULATORY_CARE_PROVIDER_SITE_OTHER): Payer: Self-pay | Admitting: Physician Assistant

## 2016-10-08 ENCOUNTER — Ambulatory Visit (HOSPITAL_COMMUNITY)
Admission: RE | Admit: 2016-10-08 | Discharge: 2016-10-08 | Disposition: A | Discharge status: Home / Self Care | Payer: Medicaid Other | Source: Ambulatory Visit | Attending: Orthopaedic Surgery | Admitting: Orthopaedic Surgery

## 2016-10-08 ENCOUNTER — Ambulatory Visit (INDEPENDENT_AMBULATORY_CARE_PROVIDER_SITE_OTHER): Payer: Medicaid Other | Admitting: Physician Assistant

## 2016-10-08 ENCOUNTER — Encounter (INDEPENDENT_AMBULATORY_CARE_PROVIDER_SITE_OTHER): Payer: Self-pay | Admitting: Physician Assistant

## 2016-10-08 VITALS — BP 120/84 | HR 79 | Temp 98.1°F | Wt 194.4 lb

## 2016-10-08 DIAGNOSIS — H6121 Impacted cerumen, right ear: Secondary | ICD-10-CM | POA: Diagnosis not present

## 2016-10-08 DIAGNOSIS — F410 Panic disorder [episodic paroxysmal anxiety] without agoraphobia: Secondary | ICD-10-CM | POA: Diagnosis not present

## 2016-10-08 DIAGNOSIS — F319 Bipolar disorder, unspecified: Secondary | ICD-10-CM

## 2016-10-08 DIAGNOSIS — M50322 Other cervical disc degeneration at C5-C6 level: Secondary | ICD-10-CM | POA: Insufficient documentation

## 2016-10-08 DIAGNOSIS — M542 Cervicalgia: Secondary | ICD-10-CM | POA: Diagnosis present

## 2016-10-08 DIAGNOSIS — F209 Schizophrenia, unspecified: Secondary | ICD-10-CM

## 2016-10-08 MED ORDER — CLONAZEPAM 1 MG PO TABS: 1.0000 mg | ORAL_TABLET | Freq: Two times a day (BID) | ORAL | 1 refills | Status: DC | PRN

## 2016-10-08 MED ORDER — CARBAMIDE PEROXIDE 6.5 % OT SOLN: 5.0000 [drp] | Freq: Two times a day (BID) | OTIC | 0 refills | Status: DC

## 2016-10-08 MED ORDER — QUETIAPINE FUMARATE 50 MG PO TABS: 50.0000 mg | ORAL_TABLET | Freq: Three times a day (TID) | ORAL | 0 refills | Status: DC

## 2016-10-08 MED ORDER — CLONAZEPAM 1 MG PO TABS: 1.0000 mg | ORAL_TABLET | Freq: Two times a day (BID) | ORAL | 0 refills | Status: DC | PRN

## 2016-10-08 MED ORDER — QUETIAPINE FUMARATE 50 MG PO TABS: ORAL_TABLET | ORAL | 0 refills | Status: DC

## 2016-10-08 MED ORDER — OMEPRAZOLE 20 MG PO CPDR: 20.0000 mg | DELAYED_RELEASE_CAPSULE | Freq: Every day | ORAL | 3 refills | Status: DC

## 2016-10-08 NOTE — Progress Notes (Signed)
Subjective:  Patient ID: Marilyn Hurley, female    DOB: 13-Sep-1960  Age: 56 y.o. MRN: [id number]  CC: panic attacks  HPI Marilyn Hurley is a 56 y.o. female with a PMH of Depression, bipolar disorder, anxiety, panic attacks, and ?schizophrenia? Presents to f/u on panic attacks and bipolar disorder. Went to psychiatrist at [facility] and was switched from 50 mg Seroquel TID to 150 mg XR. Says that she has insomnia on the Seroquel 150 XR but didn't have this issue with 50 mg IR TID usage. In regards to Xanax, psychiatrist would not refill. Says she does not want to return to [facility]. Got a refill from Dr. Doreene Burke for Clonazepam 0.5mg  which is not effective for her panic attacks. Also endorses irritability, rapid and continuous thoughts, recent episodes of depression. Pt states she was doing much better with Seroquel 50mg  TID and Clonazepam 1 mg BID.      Has not been able to get thyroid panel drawn due to transportation issues. Has a map of [facility] locations and will ask friend to drive her.     Complains of bilateral breast burning. Has breast implants in place for 35 years. Left implants are "drooping down". Has a mammogram appointment this Friday.              Outpatient Medications Prior to Visit  Medication Sig Dispense Refill  . gabapentin (NEURONTIN) 300 MG capsule Take 1 capsule (300 mg total) by mouth 3 (three) times daily. 90 capsule 3  . Lactulose 20 GM/30ML SOLN Take 30 mLs (20 g total) by mouth daily as needed. 500 mL 3  . levothyroxine (SYNTHROID, LEVOTHROID) 150 MCG tablet Take 1 tablet (150 mcg total) by mouth daily. 90 tablet 3  . clonazePAM (KLONOPIN) 0.5 MG tablet Take 1 tablet (0.5 mg total) by mouth daily. 30 tablet 0  . omeprazole (PRILOSEC) 20 MG capsule Take 1 capsule (20 mg total) by mouth daily. 30 capsule 3  . QUEtiapine (SEROQUEL) 50 MG tablet TAKE 1/2 TABLET BY MOUTH TWICE DAILY X 2 DAYS, THEN 1 TWICE DAILY X 2 DAYS, THEN 1 TABLET BY MOUTH THREE TIMES  DAILY THEREAFTER 260 tablet 1   No facility-administered medications prior to visit.      ROS Review of Systems  Constitutional: Negative for chills, fever and malaise/fatigue.  HENT:       Ear echoing  Eyes: Negative for blurred vision.  Respiratory: Negative for shortness of breath.   Cardiovascular: Negative for chest pain and palpitations.  Gastrointestinal: Negative for abdominal pain and nausea.  Genitourinary: Negative for dysuria and hematuria.  Musculoskeletal: Positive for back pain. Negative for joint pain and myalgias.  Skin: Negative for rash.  Neurological: Negative for tingling and headaches.  Psychiatric/Behavioral: Positive for depression. The patient is nervous/anxious and has insomnia.     Objective:  BP 120/84 (BP Location: Left Arm, Patient Position: Sitting, Cuff Size: Normal)   Pulse 79   Temp 98.1 F (36.7 C) (Oral)   Wt 194 lb 6.4 oz (88.2 kg)   SpO2 98%   BMI 32.35 kg/m   BP/Weight 10/08/2016 09/24/2016 3/50/0938  Systolic BP 182 993 716  Diastolic BP 84 85 84  Wt. (Lbs) 194.4 193.6 186.4  BMI 32.35 32.22 31.02      Physical Exam  Constitutional: She is oriented to person, place, and time.  Well developed, overweight, NAD, polite  HENT:  Head: Normocephalic and atraumatic.  Eyes: No scleral icterus.  Neck: Normal range of motion. Neck  supple. No thyromegaly present.  Cardiovascular: Normal rate, regular rhythm and normal heart sounds.   Pulmonary/Chest: Effort normal and breath sounds normal.  Musculoskeletal: She exhibits no edema.  Neurological: She is alert and oriented to person, place, and time. No cranial nerve deficit. Coordination normal.  Skin: Skin is warm and dry. No rash noted. No erythema. No pallor.  Psychiatric: Her behavior is normal. Thought content normal.  Moderately anxious, difficulty sitting still. Rapid and pressured speech but decreased since first visits. Judgement poor, insight poor  Vitals  reviewed.    Assessment & Plan:   1. Bipolar affective disorder, remission status unspecified (HCC) - Begin QUEtiapine (SEROQUEL) 50 MG tablet; Take two pills in the morning, then one pill in the afternoon, then one pill in the evening.  Dispense: 120 tablet; Refill: 0 - Stop Seroquel XR, ineffective - Pt advised to continue to seek psychiatric help. I have given patient a list of mental health resources.  2. Panic attack - Refill clonazePAM (KLONOPIN) 1 MG tablet; Take 1 tablet (1 mg total) by mouth 2 (two) times daily as needed for anxiety.  Dispense: 20 tablet; Refill: 1  3. Schizophrenia, unspecified type (HCC) - Begin QUEtiapine (SEROQUEL) 50 MG tablet; Take two pills in the morning, then one pill in the afternoon, then one pill in the evening.  Dispense: 120 tablet; Refill: 0  4. Impacted cerumen of right ear - Begin carbamide peroxide (DEBROX) 6.5 % OTIC solution; Place 5 drops into the right ear 2 (two) times daily.  Dispense: 15 mL; Refill: 0     Meds ordered this encounter  Medications  . DISCONTD: clonazePAM (KLONOPIN) 1 MG tablet    Sig: Take 1 tablet (1 mg total) by mouth 2 (two) times daily as needed for anxiety.    Dispense:  20 tablet    Refill:  1    Order Specific Question:   Supervising Provider    Answer:   Tresa Garter [id number]  . DISCONTD: QUEtiapine (SEROQUEL) 50 MG tablet    Sig: Take 1 tablet (50 mg total) by mouth 3 (three) times daily. Take two pills in the morning, then one pill in the afternoon, then one pill in the evening.    Dispense:  120 tablet    Refill:  0    **Patient requests 90 days supply**    Order Specific Question:   Supervising Provider    Answer:   Tresa Garter [id number]  . DISCONTD: carbamide peroxide (DEBROX) 6.5 % OTIC solution    Sig: Place 5 drops into the right ear 2 (two) times daily.    Dispense:  15 mL    Refill:  0    Order Specific Question:   Supervising Provider    Answer:   Tresa Garter  [id number]  . carbamide peroxide (DEBROX) 6.5 % OTIC solution    Sig: Place 5 drops into the right ear 2 (two) times daily.    Dispense:  15 mL    Refill:  0    Order Specific Question:   Supervising Provider    Answer:   Tresa Garter [id number]  . clonazePAM (KLONOPIN) 1 MG tablet    Sig: Take 1 tablet (1 mg total) by mouth 2 (two) times daily as needed for anxiety.    Dispense:  20 tablet    Refill:  1    Order Specific Question:   Supervising Provider    Answer:   Tresa Garter [id number]  . omeprazole (  PRILOSEC) 20 MG capsule    Sig: Take 1 capsule (20 mg total) by mouth daily.    Dispense:  30 capsule    Refill:  3    Order Specific Question:   Supervising Provider    Answer:   Tresa Garter [id number]  . QUEtiapine (SEROQUEL) 50 MG tablet    Sig: Take two pills in the morning, then one pill in the afternoon, then one pill in the evening.    Dispense:  120 tablet    Refill:  0    **Patient requests 90 days supply**    Order Specific Question:   Supervising Provider    Answer:   Tresa Garter [[id number]]    Follow-up: Return in about 4 weeks (around 11/05/2016) for psych.   Clent Demark PA

## 2016-10-08 NOTE — Patient Instructions (Addendum)
Panic Attacks Panic attacks are sudden, short feelings of great fear or discomfort. You may have them for no reason when you are relaxed, when you are uneasy (anxious), or when you are sleeping. Follow these instructions at home:  Take all your medicines as told.  Check with your doctor before starting new medicines.  Keep all doctor visits. Contact a doctor if:  You are not able to take your medicines as told.  Your symptoms do not get better.  Your symptoms get worse. Get help right away if:  Your attacks seem different than your normal attacks.  You have thoughts about hurting yourself or others.  You take panic attack medicine and you have a side effect. This information is not intended to replace advice given to you by your health care provider. Make sure you discuss any questions you have with your health care provider. Document Released: 04/06/2010 Document Revised: 08/10/2015 Document Reviewed: 10/16/2012 Elsevier Interactive Patient Education  2017 [employer].   [employer]  Advocacy/Legal Legal Aid [place]:  [phone]  /  [phone]  [place]:  [phone]  Family Service of the [facility] 24-hr Crisis line:  [phone]  [facility] [place], [place]:  [phone]  [place] (custody):  [phone]  [facility]:   [phone]    Baby & Breastfeeding Car Seat Inspection @ Various [place].- call [place] [facility]  [phone]  [place] [facility]act[facility] [phone]  [place]: [phone] ([place]);  [phone] ([place])  [place] League:  [phone]   [place] Child Development: [phone] [facility]) / [phone] (HP)  - Child Care Resources/ Referrals/ Scholarships  - Head Start/ Early Head Start (call or apply online)  [place] DHHS: [place] Pre-K :  [phone] / [phone]   Employment / [place]: [phone] / [place] ([place]): [phone] ([place]) / [phone] ([place])  [place]: [phone] / [phone]  [facility] [facility] Job & Career Center: [phone]  DHHS Work First: [phone] ([place]) / [phone] ([place])  [place]:  [place]:  [phone]  [employer]: [place] (furniture):  [place] Helping Hands: [phone]  [place]  [place]- SNAP/ Food Stamps: [phone]  [place]: Letta [phone] ;  HP [phone]  [place]  During the summer, text "FOOD" to [place] / Clinics (Adults) [place] (for Adults) through [facility]: [phone]  [place]:   [place]:   [phone]  Health Department:  [place]:  [phone] / [phone]  Planned [facility] of [place]:   [phone]  [facility]:   [phone] x [place]:   [place]:  [place]:  [phone]   [place] for [facility] [place]):  [phone]  [facility]:  [place]:  [place]:  [place]:  [place]  www.youthsafegso.org  PFLAG  889-169-4503 / info@[place] .org  The [place]:  [phone]   Mental Health/ Substance Use Family Service of the [place]  [place]:  [phone] or [phone]  [facility]  of Care:  [phone]  Journeys Counseling:  [place]:  [phone]    [place] Sessions (walk-ins)  [phone] / [address]  [place]:  [phone]  Alcoholics Anonymous:  282-081-3887  Narcotics Anonymous:  380-438-0013  Quit Smoking Hotline:  800-QUIT-NOW (985)720-0534)   Parenting [place]:  [place]:  [phone]  YWCA: [phone]  [employer]: Bringing Out the Best:  [phone]               Thriving at 35 (Hispanic families): [phone]  Healthy Start ([place]):  [phone]  Parents as Teachers:  [place] Together (Immigrants): [phone]   Poison Control [phone]  [place] Open Doors Application: ImDemand.es  [place] of [place]: http://www.[place].gov/index.aspx?page=3615   Special Needs Family Support Network:  763-273-9189  [place] of :   [place] or [phone] /  [place]:  [phone]  [place]:  [place] (CDSA):  [phone]  [facility] (Care Coordination for Children):  [phone]   Transportation Medicaid Transportation: [phone] to apply  [place]: [phone] (reduced-fare bus ID to [place])  SCAT Paratransit services: Eligible riders only, call [phone] for application   Tutoring/Mentoring [place]: [place]: [phone] ([place])  ACES through child's school: [place]: contact your local [place] Program: [phone]

## 2016-10-09 ENCOUNTER — Other Ambulatory Visit: Payer: Self-pay | Admitting: Gastroenterology

## 2016-10-09 DIAGNOSIS — R1011 Right upper quadrant pain: Secondary | ICD-10-CM

## 2016-10-10 ENCOUNTER — Telehealth (INDEPENDENT_AMBULATORY_CARE_PROVIDER_SITE_OTHER): Payer: Self-pay | Admitting: Physician Assistant

## 2016-10-10 NOTE — Telephone Encounter (Signed)
Patient called stated just wants to let Domenica Fail PA know that she has appt 11/04/2016 scheduled for colonoscopy and and GI is also going to look down her throat.  Also has appt with [facility] on 10/14/2016, per patient is taking her medications and is sleeping well.

## 2016-10-15 ENCOUNTER — Ambulatory Visit
Admission: RE | Admit: 2016-10-15 | Discharge: 2016-10-15 | Disposition: A | Discharge status: Home / Self Care | Payer: Medicaid Other | Source: Ambulatory Visit | Attending: Internal Medicine | Admitting: Internal Medicine

## 2016-10-15 DIAGNOSIS — Z1239 Encounter for other screening for malignant neoplasm of breast: Secondary | ICD-10-CM

## 2016-10-16 ENCOUNTER — Ambulatory Visit (INDEPENDENT_AMBULATORY_CARE_PROVIDER_SITE_OTHER): Payer: Medicaid Other | Admitting: Orthopaedic Surgery

## 2016-10-16 ENCOUNTER — Encounter (INDEPENDENT_AMBULATORY_CARE_PROVIDER_SITE_OTHER): Payer: Self-pay | Admitting: Orthopaedic Surgery

## 2016-10-16 ENCOUNTER — Telehealth (INDEPENDENT_AMBULATORY_CARE_PROVIDER_SITE_OTHER): Payer: Self-pay | Admitting: Physician Assistant

## 2016-10-16 VITALS — BP 110/76 | HR 77 | Ht 65.0 in | Wt 194.0 lb

## 2016-10-16 DIAGNOSIS — M542 Cervicalgia: Secondary | ICD-10-CM | POA: Diagnosis not present

## 2016-10-16 NOTE — Telephone Encounter (Signed)
Patient called stated went to Ortho today and Ortho doctor recommend for patient to get x-ray of lower part of back.  Per patient she was told to contact PCP to request X-ray.  Per patient wants x-ray done because still has neck pain and lower part of back and would like to get x-ray done before the 11/07/2016   Please follow up with patient

## 2016-10-16 NOTE — Progress Notes (Signed)
Office Visit Note   Patient: Marilyn Hurley           Date of Birth: 05-27-1960           MRN: [id number] Visit Date: 10/16/2016              Requested by: Clent Demark, PA-C [place], [place] [id number] PCP: Clent Demark, PA-C   Assessment & Plan: Visit Diagnoses:  1. Neck pain     Plan: MRI scan shows some minimal disc degeneration at C5-6 without compression. Significant spinal or foraminal stenosis. I discussed her I recommend a walking program. She can follow-up with  Altamease Oiler PA. No indication for cervical surgery at this time.  Follow-Up Instructions: No Follow-up on file.   Orders:  No orders of the defined types were placed in this encounter.  No orders of the defined types were placed in this encounter.     Procedures: No procedures performed   Clinical Data: No additional findings.   Subjective: Chief Complaint  Patient presents with  . Neck - Pain, Follow-up    HPI 56 year old female returns she states she is ongoing neck pain that radiates to the shoulders has an area of tenderness near C7 spinous process. She states her pain is significant and bothers her a lot on a daily basis. She was on Neurontin but this was stopped since she is on some other psychotropic drug for her bipolar disorder and also takes Seroquel. Patient states she gets hives from ibuprofen. She also cannot take Aleve. Patient states she's also has a chronic back problems. She states in [place] they talk to her about fusion.  Review of Systems U systems positive for depression, sleep apnea, thyroid condition, bipolar disorder.Marland Kitchen History of schizophrenia anxiety state, panic attacks hypothyroidism.   Objective: Vital Signs: BP 110/76   Pulse 77   Ht 5\' 5"  (1.651 m)   Wt 194 lb (88 kg)   BMI 32.28 kg/m   Physical Exam  Constitutional: She is oriented to person, place, and time. She appears well-developed.  HENT:  Head: Normocephalic.  Right Ear: External  ear normal.  Left Ear: External ear normal.  Eyes: Pupils are equal, round, and reactive to light.  Neck: No tracheal deviation present. No thyromegaly present.  Cardiovascular: Normal rate.   Pulmonary/Chest: Effort normal.  Abdominal: Soft.  Musculoskeletal:  Patient is 50% rotation of her Left when she encounters pain she has pain with extension of her neck. No supra-clavicular lymphadenopathy. ME reflexes are 2+ and symmetrical. No atrophy up extremities no rash. Normal heel toe gait.  Neurological: She is alert and oriented to person, place, and time.  Skin: Skin is warm and dry.  Psychiatric: She has a normal mood and affect. Her behavior is normal.    Ortho Exam EHL anterior tib is strong bilaterally. She has 1+ knee and ankle jerk symmetrical. Skin over lumbar spine is normal.  Specialty Comments:  No specialty comments available.  Imaging: Study Result   CLINICAL DATA:  Neck pain with right arm pain  EXAM: MRI CERVICAL SPINE WITHOUT CONTRAST  TECHNIQUE: Multiplanar, multisequence MR imaging of the cervical spine was performed. No intravenous contrast was administered.  COMPARISON:  Cervical radiographs 03/19/2016  FINDINGS: Alignment: Normal alignment.  Mild kyphosis at C5-6  Vertebrae: Negative  Cord: Negative  Posterior Fossa, vertebral arteries, paraspinal tissues: Negative  Disc levels:  C2-3:  Negative  C3-4:  Negative  C4-5:  Negative  C5-6: Disc degeneration with  diffuse uncinate spurring. No significant spinal or foraminal stenosis.  C6-7:  Negative  C7-T1:  Negative  IMPRESSION: Disc degeneration and mild spurring at C5-6 without significant spinal or foraminal stenosis. Otherwise negative       PMFS History: Patient Active Problem List   Diagnosis Date Noted  . Panic attack 09/24/2016  . Hypothyroidism 09/24/2016  . Transaminitis 09/24/2016  . Colon cancer screening 09/24/2016  . Breast cancer screening  09/24/2016  . Schizophrenia ([place]) 07/11/2016  . Bipolar disorder ([place]) 07/11/2016  . Anxiety state 07/11/2016   Past Medical History:  Diagnosis Date  . Depression   . Thyroid disease     No family history on file.  Past Surgical History:  Procedure Laterality Date  . AUGMENTATION MAMMAPLASTY Bilateral 1989  . breast implant    . CESAREAN SECTION     Social History   Occupational History  . Not on file.   Social History Main Topics  . Smoking status: Current Some Day Smoker    Types: Cigarettes  . Smokeless tobacco: Never Used  . Alcohol use No  . Drug use: No  . Sexual activity: Not on file

## 2016-10-16 NOTE — Telephone Encounter (Signed)
Have patient return for focus on lower back.

## 2016-10-16 NOTE — Telephone Encounter (Signed)
We have no control over how soon radiology can ger her scheduled but I will Fwd to PCP to place order. Nat Christen, CMA

## 2016-10-17 ENCOUNTER — Ambulatory Visit
Admission: RE | Admit: 2016-10-17 | Discharge: 2016-10-17 | Disposition: A | Discharge status: Home / Self Care | Payer: Medicaid Other | Source: Ambulatory Visit | Attending: Gastroenterology | Admitting: Gastroenterology

## 2016-10-17 DIAGNOSIS — R1011 Right upper quadrant pain: Secondary | ICD-10-CM

## 2016-10-19 LAB — HEPATIC FUNCTION PANEL
ALBUMIN: 4.3 g/dL (ref 3.5–5.5)
ALK PHOS: 90 IU/L (ref 39–117)
ALT: 80 IU/L — ABNORMAL HIGH (ref 0–32)
AST: 81 IU/L — ABNORMAL HIGH (ref 0–40)
Bilirubin Total: 0.5 mg/dL (ref 0.0–1.2)
Bilirubin, Direct: 0.14 mg/dL (ref 0.00–0.40)
Total Protein: 8.5 g/dL (ref 6.0–8.5)

## 2016-10-19 LAB — TSH: TSH: 15.4 u[IU]/mL — ABNORMAL HIGH (ref 0.450–4.500)

## 2016-10-19 LAB — T4, FREE: Free T4: 0.97 ng/dL (ref 0.82–1.77)

## 2016-10-25 ENCOUNTER — Telehealth: Payer: Self-pay | Admitting: Physician Assistant

## 2016-10-25 ENCOUNTER — Telehealth: Payer: Self-pay

## 2016-10-25 ENCOUNTER — Other Ambulatory Visit: Payer: Self-pay | Admitting: Gastroenterology

## 2016-10-25 DIAGNOSIS — R1011 Right upper quadrant pain: Secondary | ICD-10-CM

## 2016-10-25 NOTE — Telephone Encounter (Signed)
Please call patient regarding lab results.  Patient confused on why [facility] nurse called.  I also sent message to [place] at [facility].

## 2016-10-25 NOTE — Telephone Encounter (Signed)
Patient has been provided with results. Nat Christen, CMA

## 2016-10-25 NOTE — Telephone Encounter (Signed)
Contacted pt to go over lab results pt didn't answer lvm asking pt to give me a call at her earliest convenience   If pt calls back please give results: Thyuroid function still low but slowly improving, please continue taking your thyroid medication as prescribed. Liver enzymes level are slightly elevated, please avoid alcohol, avoid excessive tylenol intake. We will monitor the liver function

## 2016-10-25 NOTE — Telephone Encounter (Signed)
Patient called needs to know lab results.  Please follow up

## 2016-10-28 ENCOUNTER — Ambulatory Visit (INDEPENDENT_AMBULATORY_CARE_PROVIDER_SITE_OTHER): Payer: Medicaid Other | Admitting: Clinical

## 2016-10-28 ENCOUNTER — Other Ambulatory Visit (HOSPITAL_COMMUNITY)
Admission: RE | Admit: 2016-10-28 | Discharge: 2016-10-28 | Disposition: A | Discharge status: Home / Self Care | Payer: Medicaid Other | Source: Ambulatory Visit | Attending: Obstetrics & Gynecology | Admitting: Obstetrics & Gynecology

## 2016-10-28 ENCOUNTER — Ambulatory Visit (INDEPENDENT_AMBULATORY_CARE_PROVIDER_SITE_OTHER): Payer: Medicaid Other | Admitting: Obstetrics & Gynecology

## 2016-10-28 ENCOUNTER — Encounter: Payer: Self-pay | Admitting: Obstetrics & Gynecology

## 2016-10-28 VITALS — BP 126/86 | HR 84 | Wt 194.6 lb

## 2016-10-28 DIAGNOSIS — F39 Unspecified mood [affective] disorder: Secondary | ICD-10-CM | POA: Diagnosis not present

## 2016-10-28 DIAGNOSIS — R1013 Epigastric pain: Secondary | ICD-10-CM | POA: Insufficient documentation

## 2016-10-28 DIAGNOSIS — N870 Mild cervical dysplasia: Secondary | ICD-10-CM | POA: Insufficient documentation

## 2016-10-28 DIAGNOSIS — N9089 Other specified noninflammatory disorders of vulva and perineum: Secondary | ICD-10-CM

## 2016-10-28 DIAGNOSIS — Z0000 Encounter for general adult medical examination without abnormal findings: Secondary | ICD-10-CM | POA: Diagnosis not present

## 2016-10-28 DIAGNOSIS — Z124 Encounter for screening for malignant neoplasm of cervix: Secondary | ICD-10-CM | POA: Diagnosis not present

## 2016-10-28 DIAGNOSIS — G8929 Other chronic pain: Secondary | ICD-10-CM | POA: Insufficient documentation

## 2016-10-28 MED ORDER — METRONIDAZOLE 500 MG PO TABS: 500.0000 mg | ORAL_TABLET | Freq: Two times a day (BID) | ORAL | 0 refills | Status: DC

## 2016-10-28 NOTE — Patient Instructions (Signed)
My Safety Plan:   Step 1: Warning signs (thoughts, images, mood, situation, behavior) that a crisis may be developing: Start having negative thoughs  Step 2: Internal coping strategies: Things I can do to take my mind off my problems without contacting another person (music, relaxation technique, physical activity): Call friends in [place], listen to county music  Step 3: People I can ask for help:  Name, relationship, contact: Rollene Rotunda, [phone] Name, relationship, contact: Lyanne Co, [phone] Name, relationship, contact:Friend, [place] Name relationship, contact: Lesleigh Noe [phone] Name, relationship, contact: Friend,Tori, [phone]  Step 5: Agencies I can contact during a crisis:      1. 9-1-1     2. [employer] (24/7 walk-in) [address], [place], [place]     3. [place]: Intake- [id number] [phone]     4. Closest Emergency Room Address: (see [facility] below)  Suicide Prevention Lifeline Phone: [phone]  Step 6: Making the environment safe- Have friend or family remove from home: [name] agrees to do this * Weapons in the home * Medication in the home (including Tylenol)  Step 7: The one thing that is most important to me and worth living for is: My son   Signature of Patient: _________________________________________________  Signature of Provider: ________________________________________________  [facility] [place], [place], [place]. [facility] [address] [place], [place]  [facility] [address], [place], [place]  [facility] [address] [phone]  [facility] [address] [place], [place]

## 2016-10-28 NOTE — BH Specialist Note (Deleted)
Error

## 2016-10-28 NOTE — Progress Notes (Signed)
   Subjective:    Patient ID: Ardra Kuznicki, female    DOB: Apr 13, 1960, 56 y.o.   MRN: [id number]  HPI 56 yo SW lady here for removal of "skin tags" from her vulva.   Review of Systems She gives a h/o abnormal paps in the past, reports that her last pap was about 4 years ago.    Objective:   Physical Exam Well nourished, well hydrated white female, no apparent distress Breathing, conversing, and ambulating normally Abd- benign Vaginal discharge c/w BV 2 lesions on her upper/mid right labia majora c/w skin tags 1 lesion on the bottom of her right labia majora, somewhat more complex and about 1 cm in diameter On the left groin crease at mid labia level was a 1.5 cm complex skin growth. All areas were prepped with betadine. Lidocaine was used for anethesia. I removed all of the lesions. Silver nitrate was used for hemostasis on the small ones (2) and I closed the other 2 with 4-0 vicryl suture. She tolerated the procedure well.       Assessment & Plan:  Preventative- pap with cotesting Discharge c/w BV- flagyl prescribed Wet prep sent Vulvar lesions- await pathology

## 2016-10-28 NOTE — BH Specialist Note (Signed)
Integrated Behavioral Health Initial Visit  MRN: [id number] Name: Marilyn Hurley   Session Start time: 11:30 Session End time: 11:56 Total time: 30 minutes  Type of Service: [place] Interpretor:No. Interpretor Name and Language: n/a   Warm Hand Off Completed.       SUBJECTIVE: Marilyn Hurley is a 56 y.o. female accompanied by patient and good friend [name]. Patient was referred by Dr Hulan Fray for [place]. Patient reports the following symptoms/concerns: Pt states that she has no intent or plan of harm to self, and has learned to "deal with" the voices and hallucinations, and pay no attention to them, denies current SI; open to creating safety plan to take back to her PCP, who is monitoring her BH meds. Pt also concerned about need for clothing. Duration of problem: Ongoing; Severity of problem: severe  OBJECTIVE: Mood: Anxious and Affect: Appropriate Risk of harm to self or others: Suicidal ideation No plan to harm self or others   LIFE CONTEXT: Family and Social: Many supportive friends; 21yo son lives out-of[place]state, but helps financially and talks to routinely School/Work: Recently denied disability; began working with Chief Executive Officer to contest Self-Care: Skilled at self-advocacy; has created a supportive network of friends for social support Life Changes: Recent disability denial; new changes to [facility] medication with PCP  GOALS ADDRESSED: Patient will reduce symptoms of: anxiety, depression, mood instability and stress and increase knowledge and/or ability of: self-management skills and stress reduction and also: Increase healthy adjustment to current life circumstances   INTERVENTIONS: Solution-Focused Strategies, Psychoeducation and/or Health Education and Link to [employer]  Standardized Assessments completed: GAD-7 and PHQ 2&9 with C-SSRS  ASSESSMENT: Patient currently experiencing Mood disorder. Patient may benefit from psychoeducation  and brief therapeutic intervention regarding coping with symptoms of mood disorder, along with safety planning today.  PLAN: 1. Follow up with behavioral health clinician on : As needed 2. Behavioral recommendations:  -Follow contract for safety; share with PCP at next visit(Friend Bartolo Darter agrees to remind about this) -Continue adhering to [facility] medications, as prescribed by PCP -Consider [facility] counseling appointment to obtain clothing referrals(Friend Bartolo Darter will remind about this) -Read educational materials regarding coping with symptoms of anxiety and depression related to mood disorder 3. Referral(s): [place] (In Clinic) and [employer] Resources:  Clothing 4. "From scale of 1-10, how likely are you to follow plan?": 9  Garlan Fair, LCSWA  Depression screen [facility] 2/9 10/28/2016 09/24/2016 06/21/2016 06/10/2016  Decreased Interest 3 0 3 3  Down, Depressed, Hopeless 3 0 3 3  PHQ - 2 Score 6 0 6 6  Altered sleeping 3 - 3 3  Tired, decreased energy 3 - 3 3  Change in appetite 3 - 3 3  Feeling bad or failure about yourself  3 - 3 3  Trouble concentrating 3 - 3 3  Moving slowly or fidgety/restless 3 - 2 3  Suicidal thoughts 1 - 0 0  PHQ-9 Score 25 - 23 24  Difficult doing work/chores - - - Somewhat difficult   GAD 7 : Generalized Anxiety Score 10/28/2016 06/21/2016 06/10/2016  Nervous, Anxious, on Edge 3 3 3   Control/stop worrying 3 3 3   Worry too much - different things 3 3 3   Trouble relaxing 3 3 3   Restless 3 3 3   Easily annoyed or irritable 3 3 3   Afraid - awful might happen 3 3 3   Total GAD 7 Score 21 21 21   Anxiety Difficulty - - Somewhat difficult  Outpatient Encounter Prescriptions as of 10/28/2016  Medication Sig  . carbamide peroxide (DEBROX) 6.5 % OTIC solution Place 5 drops into the right ear 2 (two) times daily. (Patient not taking: Reported on 10/16/2016)  . clonazePAM (KLONOPIN) 1 MG tablet Take 1 tablet (1 mg  total) by mouth 2 (two) times daily as needed for anxiety.  . gabapentin (NEURONTIN) 300 MG capsule Take 1 capsule (300 mg total) by mouth 3 (three) times daily. (Patient not taking: Reported on 10/16/2016)  . Lactulose 20 GM/30ML SOLN Take 30 mLs (20 g total) by mouth daily as needed. (Patient not taking: Reported on 10/16/2016)  . levothyroxine (SYNTHROID, LEVOTHROID) 150 MCG tablet Take 1 tablet (150 mcg total) by mouth daily.  . Linaclotide (LINZESS PO) Take 79 mcg by mouth daily.  . QUEtiapine (SEROQUEL) 50 MG tablet Take two pills in the morning, then one pill in the afternoon, then one pill in the evening.   No facility-administered encounter medications on file as of 10/28/2016.

## 2016-10-29 ENCOUNTER — Other Ambulatory Visit: Payer: Self-pay

## 2016-10-29 ENCOUNTER — Telehealth: Payer: Self-pay | Admitting: *Deleted

## 2016-10-29 ENCOUNTER — Other Ambulatory Visit: Payer: Self-pay | Admitting: Obstetrics & Gynecology

## 2016-10-29 ENCOUNTER — Other Ambulatory Visit: Payer: Self-pay | Admitting: *Deleted

## 2016-10-29 ENCOUNTER — Ambulatory Visit (INDEPENDENT_AMBULATORY_CARE_PROVIDER_SITE_OTHER): Payer: Medicaid Other | Admitting: Physician Assistant

## 2016-10-29 ENCOUNTER — Encounter (INDEPENDENT_AMBULATORY_CARE_PROVIDER_SITE_OTHER): Payer: Self-pay | Admitting: Physician Assistant

## 2016-10-29 VITALS — BP 114/87 | HR 70 | Temp 98.0°F | Wt 194.0 lb

## 2016-10-29 DIAGNOSIS — R1013 Epigastric pain: Secondary | ICD-10-CM | POA: Diagnosis not present

## 2016-10-29 DIAGNOSIS — G8929 Other chronic pain: Secondary | ICD-10-CM | POA: Diagnosis not present

## 2016-10-29 DIAGNOSIS — F418 Other specified anxiety disorders: Secondary | ICD-10-CM | POA: Diagnosis not present

## 2016-10-29 DIAGNOSIS — M5442 Lumbago with sciatica, left side: Secondary | ICD-10-CM

## 2016-10-29 DIAGNOSIS — F259 Schizoaffective disorder, unspecified: Secondary | ICD-10-CM | POA: Diagnosis not present

## 2016-10-29 DIAGNOSIS — F319 Bipolar disorder, unspecified: Secondary | ICD-10-CM

## 2016-10-29 DIAGNOSIS — F17200 Nicotine dependence, unspecified, uncomplicated: Secondary | ICD-10-CM | POA: Diagnosis not present

## 2016-10-29 DIAGNOSIS — R102 Pelvic and perineal pain: Secondary | ICD-10-CM

## 2016-10-29 DIAGNOSIS — M546 Pain in thoracic spine: Secondary | ICD-10-CM | POA: Diagnosis not present

## 2016-10-29 DIAGNOSIS — R229 Localized swelling, mass and lump, unspecified: Secondary | ICD-10-CM | POA: Diagnosis not present

## 2016-10-29 MED ORDER — CLONAZEPAM 1 MG PO TABS: 1.0000 mg | ORAL_TABLET | Freq: Two times a day (BID) | ORAL | 0 refills | Status: AC | PRN

## 2016-10-29 MED ORDER — OXYCODONE-ACETAMINOPHEN 5-325 MG PO TABS: 1.0000 | ORAL_TABLET | ORAL | 0 refills | Status: DC | PRN

## 2016-10-29 MED ORDER — ESTROGENS, CONJUGATED 0.625 MG/GM VA CREA: 1.0000 | TOPICAL_CREAM | Freq: Every day | VAGINAL | 12 refills | Status: DC

## 2016-10-29 MED ORDER — ESTRADIOL 0.1 MG/GM VA CREA: 1.0000 | TOPICAL_CREAM | VAGINAL | 1 refills | Status: DC

## 2016-10-29 MED ORDER — LIDOCAINE 5 % EX OINT: 1.0000 "application " | TOPICAL_OINTMENT | CUTANEOUS | 0 refills | Status: DC | PRN

## 2016-10-29 MED ORDER — VARENICLINE TARTRATE 0.5 MG X 11 & 1 MG X 42 PO MISC: ORAL | 0 refills | Status: DC

## 2016-10-29 MED ORDER — METRONIDAZOLE 500 MG PO TABS: 500.0000 mg | ORAL_TABLET | Freq: Two times a day (BID) | ORAL | 0 refills | Status: DC

## 2016-10-29 MED ORDER — RISPERIDONE 2 MG PO TABS: 2.0000 mg | ORAL_TABLET | Freq: Every day | ORAL | 1 refills | Status: DC

## 2016-10-29 NOTE — Progress Notes (Signed)
error 

## 2016-10-29 NOTE — Telephone Encounter (Signed)
Patient is requesting something be called in for her pain and estrace cream for lubrication. Medication  has been called into [facility].

## 2016-10-29 NOTE — Addendum Note (Signed)
Addended by: Phillip Heal, Yu Cragun A on: 10/29/2016 09:48 AM   Modules accepted: Orders

## 2016-10-29 NOTE — Patient Instructions (Signed)
Schizoaffective Disorder Schizoaffective disorder (ScAD) is a mental illness. It causes symptoms that are a mixture of a psychotic disorder (schizophrenia) and a mood (affective) disorder. A psychotic disorder involves losing touch with reality. ScAD usually occurs in cycles. Periods of severe symptoms may be followed by periods of less severe symptoms or improvement. The illness affects men and women equally, but it usually appears at an earlier age (teenage or early adult years) in men. People who have family members with schizophrenia, bipolar disorder, or ScAD are at higher risk of developing ScAD. ScAD may interfere with personal relationships or normal daily activities. People with ScAD are at a higher risk for:  Job loss.  Social aloneness (isolation).  Health problems.  Anxiety.  Substance use disorders.  Suicide.  What are the causes? The exact cause of this condition is not known. What increases the risk? The following factors may make you more likely to develop this condition:  Problems during your mother's pregnancy and after your birth, such as: ? Your mother having the flu (influenza) during the second semester of the pregnancy. ? Exposure to drugs, alcohol, illnesses, or other poisons (toxins) before birth. ? Low birth weight.  A brain infection or viral infection.  Problems with brain structure or function.  Having family members with bipolar disorder, ScAD, or schizophrenia.  Substance abuse.  Having been diagnosed with a mental health condition in the past.  Being a victim of neglect or long-term (chronic) abuse.  What are the signs or symptoms? At any one time, people with ScAD may have psychotic symptoms only or have both psychotic and affective symptoms. Psychotic symptoms may include:  Hearing, seeing, or feeling things that are not there (hallucinations).  Having fixed, false beliefs (delusions). The delusions usually include being attacked,  harassed, or plotted against (paranoid delusions).  Speaking in a way that makes no sense to others (disorganized speech).  Confusing or odd behavior.  Loss of motivation for normal daily activities, such as self-care.  Withdrawal from social contacts (social isolation).  Lack of emotions.  Affective symptoms may include:  Symptoms similar to major depression, such as: ? Depressed mood. ? Loss of interest in activities that are usually pleasurable (anhedonia). ? Sleeping more or less than normal. ? Feeling worthless or excessively guilty. ? Lack of energy or motivation. ? Trouble concentrating. ? Eating more or less than usual. ? Thinking a lot about death or suicide.  Symptoms similar to bipolar mania. Bipolar mania refers to periods of severe elation, irritability, and high energy that are experienced by people who have bipolar disorder. These symptoms may include: ? Abnormally elevated or irritable mood. ? Abnormally increased energy or activity. ? More confidence than normal or feeling that you are able to do anything (grandiosity). ? Feeling rested after getting less sleep than normal. ? Being easily distracted. ? Talking more than usual or feeling pressure to keep talking. ? Feeling that your thoughts are racing. ? Engaging in high-risk activities.  How is this diagnosed? ScAD is diagnosed through an assessment by your health care provider. Your health care provider may refer you to a mental health specialist for evaluation. The mental health specialist:  Will observe and ask questions about your thoughts, behavior, mood, and ability to function in daily life.  May ask questions about your medical history and use of drugs, including prescription medicines. Certain medical conditions and substances can cause symptoms that resemble ScAD.  May do blood tests and imaging tests.  There are two   types of ScAD:  Depressive ScAD. This type is diagnosed when you have only  depressive symptoms.  Bipolar ScAD. This type is diagnosed if your affective symptoms are only manic or are a mixture of manic and depressive.  How is this treated? ScAD is usually a lifelong (chronic) illness that requires long-term treatment. Treatment may include:  Medicine. Different types of medicine are used to treat ScAD. The exact combination depends on the type and severity of your symptoms. ? Antipsychotic medicine may be used to control psychotic symptoms such as delusions, paranoia, and hallucinations. ? Mood stabilizers may be used to balance the highs and lows of bipolar manic mood swings. ? Antidepressant medicines may be used to treat depressive symptoms.  Counseling or talk therapy. Individual, group, or family counseling may be helpful in providing education, support, and guidance. Many people with ScAD also benefit from social skills and job skills (vocational) training.  A combination of medicine and counseling is usually best for managing the disorder over time. A procedure in which electricity is applied to the brain through the scalp (electroconvulsive therapy) may be used to treat people with severe manic symptoms who do not respond to medicine and counseling. Follow these instructions at home:  Take over-the-counter and prescription medicines only as told by your health care provider. Check with your health care provider before starting new medicines.  Surround yourself with people who care about you and can help you manage your condition.  Keep stress under control. Stress may make symptoms worse.  Avoid alcohol and drugs. They can affect how medicine works and make symptoms worse.  Keep all follow-up visits as told by your health care provider and counselor. This is important. Contact a health care provider if:  You are not able to take your medicines as prescribed.  Your symptoms get worse. Get help right away if:  You feel out of control.  You or others  notice warning signs of suicide, such as: ? Increased use of drugs or alcohol ? Expressing feelings of not having a purpose in life or feeling trapped, guilty, anxious, agitated, or hopeless. ? Withdrawing from friends and family. ? Showing uncontrolled anger, recklessness, and dramatic mood changes. ? Talking about suicide or searching for methods. If you ever feel like you may hurt yourself or others, or have thoughts about taking your own life, get help right away. You can go to your nearest emergency department or call:  Your local emergency services (911 in the [place]  A suicide crisis helpline, such as the National Suicide Prevention Lifeline at 1-800-273-8255. This is open 24 hours a day.  Summary  Schizoaffective disorder causes symptoms that are a mixture of a psychotic disorder and a mood disorder.  A combination of medicine and counseling is usually best for managing the disorder over time.  People who have schizoaffective disorder are at risk for suicide. Get help right away if you or someone else notices warning signs of suicide. This information is not intended to replace advice given to you by your health care provider. Make sure you discuss any questions you have with your health care provider. Document Released: 07/15/2006 Document Revised: 12/15/2015 Document Reviewed: 12/15/2015 Elsevier Interactive Patient Education  2018 Elsevier Inc.  

## 2016-10-29 NOTE — Progress Notes (Signed)
Subjective:  Patient ID: Marilyn Hurley, female    DOB: [birth year]  Age: 56 y.o. MRN: [id number]  CC:  F/u multiple conditions.  HPI   Marilyn Hurley is a 56 y.o. female with a PMH of Depression, bipolar disorder, anxiety, panic attacks, PTSD, and schizoaffective disorder presents to f/u on psychiatric disorders. Says she has been to [facility] to see a psychologist and was told to have PCP manage medications. She is currently taking Seroquel and does not think it is effective. She is having visual and auditory hallucinations. Hears people whispering incomprehensible words. Also sees an old man in her home but he does not say anything. Patient has ruminating thoughts, feels restless, unable to sleep properly, and has much pent up anger.     Has a mass in the right axilla proximal to the right pectoralis. Mass is tender and fluctuant. There is no erythema, suppuration, or bleeding. Pointed out her mass to her orthopedic surgeon and was advised to have PCP refer to a general surgeon.      Has chronic LBP with left sided sciatica. Was seen at her orthopedic specialist and told that she was been evaluated only for cervicalgia. Orthopedist told her to have PCP order an X ray.      Has a soft fluctuant mass in the right axilla. Believes the mass has grown and is now tender to palpation. No erythema, suppuration, or bleeding.      She is being evaluated for chronic epigastric pain by GI. Says her CT abdomen is tomorrow. Says pain continues in the epigastrium in waxing and waning fashion. OTC omeprazole is of little use.     Requests pharmacotherapy for smoking cessation.    Outpatient Medications Prior to Visit  Medication Sig Dispense Refill  . carbamide peroxide (DEBROX) 6.5 % OTIC solution Place 5 drops into the right ear 2 (two) times daily. (Patient not taking: Reported on 10/16/2016) 15 mL 0  . clonazePAM (KLONOPIN) 1 MG tablet Take 1 tablet (1 mg total) by mouth 2 (two) times daily as needed for  anxiety. 60 tablet 0  . conjugated estrogens (PREMARIN) vaginal cream Place 1 Applicatorful vaginally daily. 42.5 g 12  . [START ON 10/30/2016] estradiol (ESTRACE VAGINAL) 0.1 MG/GM vaginal cream Place 1 Applicatorful vaginally 3 (three) times a week. 42.5 g 1  . gabapentin (NEURONTIN) 300 MG capsule Take 1 capsule (300 mg total) by mouth 3 (three) times daily. (Patient not taking: Reported on 10/16/2016) 90 capsule 3  . Lactulose 20 GM/30ML SOLN Take 30 mLs (20 g total) by mouth daily as needed. (Patient not taking: Reported on 10/16/2016) 500 mL 3  . levothyroxine (SYNTHROID, LEVOTHROID) 150 MCG tablet Take 1 tablet (150 mcg total) by mouth daily. 90 tablet 3  . Linaclotide (LINZESS PO) Take 79 mcg by mouth daily.    . metroNIDAZOLE (FLAGYL) 500 MG tablet Take 1 tablet (500 mg total) by mouth 2 (two) times daily. 14 tablet 0  . oxyCODONE-acetaminophen (PERCOCET) 5-325 MG tablet Take 1 tablet by mouth every 4 (four) hours as needed for severe pain. 10 tablet 0  . QUEtiapine (SEROQUEL) 50 MG tablet Take two pills in the morning, then one pill in the afternoon, then one pill in the evening. 120 tablet 0   No facility-administered medications prior to visit.      ROS Review of Systems  Constitutional: Negative for chills, fever and malaise/fatigue.  Eyes: Negative for blurred vision.  Respiratory: Negative for shortness of breath.   Cardiovascular:  Negative for chest pain and palpitations.  Gastrointestinal: Positive for abdominal pain. Negative for nausea.  Genitourinary: Negative for dysuria and hematuria.  Musculoskeletal: Positive for back pain and neck pain. Negative for joint pain and myalgias.  Skin: Negative for rash.       Axillary mass  Neurological: Negative for tingling and headaches.  Psychiatric/Behavioral: Positive for depression and hallucinations. The patient is nervous/anxious and has insomnia.     Objective:  BP 114/87 (BP Location: Right Arm, Patient Position: Sitting,  Cuff Size: Normal)   Pulse 70   Temp 98 F (36.7 C) (Oral)   Wt 194 lb (88 kg)   SpO2 97%   BMI 32.28 kg/m   BP/Weight 10/29/2016 1/61/0960 06/20/4096  Systolic BP 119 147 829  Diastolic BP 87 86 76  Wt. (Lbs) 194 194.6 194  BMI 32.28 32.38 32.28      Physical Exam  Constitutional: She is oriented to person, place, and time.  NAD, obese, polite  HENT:  Head: Normocephalic and atraumatic.  Eyes: Conjunctivae are normal.  Neck: Normal range of motion. Thyromegaly: difficult to assess due to body habitus.  Difficult to assess for thyromegaly or nodules due to body habitus  Cardiovascular: Normal rate, regular rhythm and normal heart sounds.   Lower extremity distal pulses difficult to assess due to body habitus.  Pulmonary/Chest: Effort normal and breath sounds normal.  Abdominal: Soft. Bowel sounds are normal. There is no tenderness.  Difficult to assess for abdominal mass due to body habitus   Neurological: She is alert and oriented to person, place, and time. No cranial nerve deficit. Coordination normal.  Skin: Skin is warm and dry. No rash noted.  Psychiatric: She has a normal mood and affect. Her behavior is normal. Thought content normal.  Vitals reviewed.    Assessment & Plan:   1. Schizoaffective disorder, unspecified type ([place]) - Begin risperiDONE (RISPERDAL) 2 MG tablet; Take 1 tablet (2 mg total) by mouth at bedtime.  Dispense: 30 tablet; Refill: 1  2. Bipolar affective disorder, remission status unspecified ([facility]) - Begin clonazePAM (KLONOPIN) 1 MG tablet; Take 1 tablet (1 mg total) by mouth 2 (two) times daily as needed for anxiety.  Dispense: 60 tablet; Refill: 0 - Begin risperiDONE (RISPERDAL) 2 MG tablet; Take 1 tablet (2 mg total) by mouth at bedtime.  Dispense: 30 tablet; Refill: 1  3. Depression with anxiety - Begin clonazePAM (KLONOPIN) 1 MG tablet; Take 1 tablet (1 mg total) by mouth 2 (two) times daily as needed for anxiety.  Dispense: 60 tablet;  Refill: 0 - Begin risperiDONE (RISPERDAL) 2 MG tablet; Take 1 tablet (2 mg total) by mouth at bedtime.  Dispense: 30 tablet; Refill: 1  4. Skin mass - [place] Misc Soft Tissue; Future - Ambulatory referral to General Surgery.  5. Chronic left-sided low back pain with left-sided sciatica - DG Lumbar Spine Complete; Future  6. Abdominal pain, chronic, epigastric - Lipase - Amylase  7. Chronic bilateral thoracic back pain - DG Thoracic Spine 4V; Future  8. Tobacco use disorder - Begin varenicline (CHANTIX STARTING MONTH PAK) 0.5 MG X 11 & 1 MG X 42 tablet; Take one 0.5 mg tablet by mouth once daily for 3 days, then increase to one 0.5 mg tablet twice daily for 4 days, then increase to one 1 mg tablet twice daily.  Dispense: 53 tablet; Refill: 0   Meds ordered this encounter  Medications  . varenicline (CHANTIX STARTING MONTH PAK) 0.5 MG X 11 & 1  MG X 42 tablet    Sig: Take one 0.5 mg tablet by mouth once daily for 3 days, then increase to one 0.5 mg tablet twice daily for 4 days, then increase to one 1 mg tablet twice daily.    Dispense:  53 tablet    Refill:  0    Order Specific Question:   Supervising Provider    Answer:   Tresa Garter [id number]  . clonazePAM (KLONOPIN) 1 MG tablet    Sig: Take 1 tablet (1 mg total) by mouth 2 (two) times daily as needed for anxiety.    Dispense:  60 tablet    Refill:  0    Order Specific Question:   Supervising Provider    Answer:   Tresa Garter [id number]  . DISCONTD: risperiDONE (RISPERDAL) 2 MG tablet    Sig: Take 1 tablet (2 mg total) by mouth at bedtime.    Dispense:  30 tablet    Refill:  1    Order Specific Question:   Supervising Provider    Answer:   Tresa Garter [id number]  . risperiDONE (RISPERDAL) 2 MG tablet    Sig: Take 1 tablet (2 mg total) by mouth at bedtime.    Dispense:  30 tablet    Refill:  1    Order Specific Question:   Supervising Provider    Answer:   Tresa Garter [[id number]]     Follow-up: Return in about 2 weeks (around 11/12/2016) for Schizoaffective disorder.   Clent Demark PA

## 2016-10-30 ENCOUNTER — Ambulatory Visit
Admission: RE | Admit: 2016-10-30 | Discharge: 2016-10-30 | Disposition: A | Discharge status: Home / Self Care | Payer: Medicaid Other | Source: Ambulatory Visit | Attending: Gastroenterology | Admitting: Gastroenterology

## 2016-10-30 DIAGNOSIS — R1011 Right upper quadrant pain: Secondary | ICD-10-CM

## 2016-10-30 LAB — CYTOLOGY - PAP
Bacterial vaginitis: NEGATIVE
CANDIDA VAGINITIS: NEGATIVE
HPV: DETECTED — AB
Trichomonas: NEGATIVE

## 2016-10-30 MED ORDER — IOPAMIDOL (ISOVUE-300) INJECTION 61%
100.0000 mL | Freq: Once | INTRAVENOUS | Status: AC | PRN
  Administered 2016-10-30: 100 mL via INTRAVENOUS

## 2016-10-30 NOTE — Telephone Encounter (Signed)
Patient verified DOB Patient is aware of results and had a FU visit on 10/29/16. No further questions.

## 2016-10-30 NOTE — Telephone Encounter (Signed)
-----   Message from Tresa Garter, MD sent at 10/20/2016 10:06 AM EDT ----- Thyuroid function still low but slowly improving, please continue taking your thyroid medication as prescribed. Liver enzymes level are slightly elevated, please avoid alcohol, avoid excessive tylenol intake. We will monitor the liver function

## 2016-10-31 NOTE — Telephone Encounter (Signed)
-----   Message from Tresa Garter, MD sent at 10/25/2016  6:37 PM EDT ----- Please inform patient that her screening mammogram shows no evidence of malignancy. Recommend screening mammogram in one year

## 2016-10-31 NOTE — Telephone Encounter (Signed)
Patient is aware of mammogram showing no evidence of malignancy and a recommended screening be completed in one year. Medical Assistant left message on patient's home and cell voicemail. Voicemail states to give a call back to [place] with [facility] at [phone].

## 2016-11-07 ENCOUNTER — Ambulatory Visit (INDEPENDENT_AMBULATORY_CARE_PROVIDER_SITE_OTHER): Payer: Medicaid Other | Admitting: Physician Assistant

## 2016-11-08 ENCOUNTER — Other Ambulatory Visit: Payer: Self-pay

## 2016-11-08 ENCOUNTER — Ambulatory Visit (HOSPITAL_COMMUNITY)
Admission: RE | Admit: 2016-11-08 | Discharge: 2016-11-08 | Disposition: A | Discharge status: Home / Self Care | Payer: Medicaid Other | Source: Ambulatory Visit | Attending: Physician Assistant | Admitting: Physician Assistant

## 2016-11-08 ENCOUNTER — Other Ambulatory Visit (INDEPENDENT_AMBULATORY_CARE_PROVIDER_SITE_OTHER): Payer: Self-pay | Admitting: Physician Assistant

## 2016-11-08 DIAGNOSIS — M5442 Lumbago with sciatica, left side: Secondary | ICD-10-CM | POA: Diagnosis not present

## 2016-11-08 DIAGNOSIS — M5137 Other intervertebral disc degeneration, lumbosacral region: Secondary | ICD-10-CM | POA: Diagnosis not present

## 2016-11-08 DIAGNOSIS — G8929 Other chronic pain: Secondary | ICD-10-CM

## 2016-11-08 DIAGNOSIS — M546 Pain in thoracic spine: Secondary | ICD-10-CM

## 2016-11-09 LAB — LIPASE: LIPASE: 53 U/L (ref 14–72)

## 2016-11-09 LAB — AMYLASE: AMYLASE: 58 U/L (ref 31–124)

## 2016-11-11 ENCOUNTER — Telehealth: Payer: Self-pay | Admitting: *Deleted

## 2016-11-11 NOTE — Telephone Encounter (Signed)
Called pt and informed her of abnormal Pap requiring Colposcopy.  Pt voiced understanding and states she has had the procedure before. She was informed of appt scheduled on 9/21 @ 0840 for the Colpo. Pt agreed and voiced understanding.

## 2016-11-12 ENCOUNTER — Telehealth (INDEPENDENT_AMBULATORY_CARE_PROVIDER_SITE_OTHER): Payer: Self-pay | Admitting: Physician Assistant

## 2016-11-12 ENCOUNTER — Encounter (INDEPENDENT_AMBULATORY_CARE_PROVIDER_SITE_OTHER): Payer: Self-pay | Admitting: Physician Assistant

## 2016-11-12 ENCOUNTER — Ambulatory Visit (INDEPENDENT_AMBULATORY_CARE_PROVIDER_SITE_OTHER): Payer: Medicaid Other | Admitting: Physician Assistant

## 2016-11-12 VITALS — BP 122/80 | HR 73 | Temp 98.3°F | Wt 194.2 lb

## 2016-11-12 DIAGNOSIS — Z13220 Encounter for screening for lipoid disorders: Secondary | ICD-10-CM | POA: Diagnosis not present

## 2016-11-12 DIAGNOSIS — M549 Dorsalgia, unspecified: Secondary | ICD-10-CM

## 2016-11-12 DIAGNOSIS — F259 Schizoaffective disorder, unspecified: Secondary | ICD-10-CM | POA: Diagnosis not present

## 2016-11-12 DIAGNOSIS — Z131 Encounter for screening for diabetes mellitus: Secondary | ICD-10-CM | POA: Diagnosis not present

## 2016-11-12 DIAGNOSIS — G8929 Other chronic pain: Secondary | ICD-10-CM

## 2016-11-12 LAB — POCT GLYCOSYLATED HEMOGLOBIN (HGB A1C): HEMOGLOBIN A1C: 6

## 2016-11-12 MED ORDER — ACETAMINOPHEN-CODEINE #3 300-30 MG PO TABS: 1.0000 | ORAL_TABLET | Freq: Four times a day (QID) | ORAL | 0 refills | Status: AC | PRN

## 2016-11-12 NOTE — Telephone Encounter (Signed)
Mrs Kulesza wanted to let you know the name of the medicine that is causing her the rash Lamotrigine 25 mg   1 tablet x 2 weeks and after 1 week    2 tablets Thank You

## 2016-11-12 NOTE — Progress Notes (Signed)
Subjective:  Patient ID: Marilyn Hurley, female    DOB: 09/08/1960  Age: 56 y.o. MRN: [id number]  CC: f/u schizoaffective disorder  HPI  Grisell Hurley a 56 y.o.femalewith a PMH of Depression, bipolar disorder, anxiety, panic attacks, PTSD, and schizoaffective disorder presents to f/u on schizoaffective disorder. She was prescribed risperidone 2mg  qhs. Says she was up all night with the drug and was taken off Risperidone by the psychiatrist. She was put back on Seroquel at the original dose prescribed here. Patient initially wanted to be taken off of Seroquel but is now in agreement to being on Seroquel. She is now sleeping better and feeling more relaxed with Seroquel.     Patient complains of back pain from C spine to the L spine. Attributed to having large breasts. She requests a breast reduction. Recent XR of the T spine and L spine was normal except for early degenerative disc disease at L5 - S1.     Patient requests a lipid panel be drawn today. She is not currently fasting.    Outpatient Medications Prior to Visit  Medication Sig Dispense Refill  . clonazePAM (KLONOPIN) 1 MG tablet Take 1 tablet (1 mg total) by mouth 2 (two) times daily as needed for anxiety. 60 tablet 0  . conjugated estrogens (PREMARIN) vaginal cream Place 1 Applicatorful vaginally daily. 42.5 g 12  . levothyroxine (SYNTHROID, LEVOTHROID) 150 MCG tablet Take 1 tablet (150 mcg total) by mouth daily. 90 tablet 3  . LINZESS 72 MCG capsule Take 1 capsule by mouth 1 day or 1 dose.  2  . QUEtiapine (SEROQUEL) 50 MG tablet Take 1 tablet by mouth 1 day or 1 dose.  0  . EAR DROPS 6.5 % OTIC solution Apply 1 drop to eye 2 (two) times daily.  0  . estradiol (ESTRACE VAGINAL) 0.1 MG/GM vaginal cream Place 1 Applicatorful vaginally 3 (three) times a week. (Patient not taking: Reported on 11/12/2016) 42.5 g 1  . Linaclotide (LINZESS PO) Take 79 mcg by mouth daily.    . risperiDONE (RISPERDAL) 2 MG tablet Take 1 tablet (2 mg  total) by mouth at bedtime. (Patient not taking: Reported on 11/12/2016) 30 tablet 1  . varenicline (CHANTIX STARTING MONTH PAK) 0.5 MG X 11 & 1 MG X 42 tablet Take one 0.5 mg tablet by mouth once daily for 3 days, then increase to one 0.5 mg tablet twice daily for 4 days, then increase to one 1 mg tablet twice daily. (Patient not taking: Reported on 11/12/2016) 53 tablet 0  . metroNIDAZOLE (FLAGYL) 500 MG tablet Take 500 mg by mouth 2 (two) times daily.  0   No facility-administered medications prior to visit.      ROS Review of Systems  Constitutional: Negative for chills, fever and malaise/fatigue.  Eyes: Negative for blurred vision.  Respiratory: Negative for shortness of breath.   Cardiovascular: Negative for chest pain and palpitations.  Gastrointestinal: Negative for abdominal pain and nausea.  Genitourinary: Negative for dysuria and hematuria.  Musculoskeletal: Positive for back pain. Negative for joint pain and myalgias.  Skin: Negative for rash.  Neurological: Negative for tingling and headaches.  Psychiatric/Behavioral: Negative for depression. The patient is not nervous/anxious.     Objective:  BP 122/80 (BP Location: Left Arm, Patient Position: Sitting, Cuff Size: Normal)   Pulse 73   Temp 98.3 F (36.8 C) (Oral)   Wt 194 lb 3.2 oz (88.1 kg)   SpO2 95%   BMI 32.32 kg/m   BP/Weight  11/12/2016 10/29/2016 5/32/9924  Systolic BP 268 341 962  Diastolic BP 80 87 86  Wt. (Lbs) 194.2 194 194.6  BMI 32.32 32.28 32.38      Physical Exam  Constitutional: She is oriented to person, place, and time.  Well developed, overweight, NAD, polite, standing and leaning forward on counter to "help relieve stress on back"  HENT:  Head: Normocephalic and atraumatic.  Eyes: No scleral icterus.  Cardiovascular: Normal rate, regular rhythm and normal heart sounds.   Pulmonary/Chest: Effort normal and breath sounds normal.  Musculoskeletal: She exhibits no edema.  Normal aROM of the  back, however, pain elicited with flexion at approximately 35- 45 degrees.  Neurological: She is alert and oriented to person, place, and time. No cranial nerve deficit. Coordination normal.  Skin: Skin is warm and dry. No rash noted. No erythema. No pallor.  Psychiatric: She has a normal mood and affect. Her behavior is normal. Thought content normal.  Vitals reviewed.    Assessment & Plan:   1. Schizoaffective disorder, unspecified type ([place]) - Continue on Seroquel  2. Screening for diabetes mellitus - HgB A1c 6.0% in clinic today.  3. Other chronic back pain - Pt attributes pain to having large breasts. Requests a breast reduction.  - Ambulatory referral to Physical Therapy - acetaminophen-codeine (TYLENOL #3) 300-30 MG tablet; Take 1 tablet by mouth every 6 (six) hours as needed for moderate pain.  Dispense: 28 tablet; Refill: 0  4. Screening for cholesterol level - Lipid panel; Future    Meds ordered this encounter  Medications  . acetaminophen-codeine (TYLENOL #3) 300-30 MG tablet    Sig: Take 1 tablet by mouth every 6 (six) hours as needed for moderate pain.    Dispense:  28 tablet    Refill:  0    Order Specific Question:   Supervising Provider    Answer:   Tresa Garter [[id number]]    Follow-up: Return in about 4 months (around 03/14/2017).   Clent Demark PA

## 2016-11-12 NOTE — Patient Instructions (Signed)
Schizoaffective Disorder Schizoaffective disorder (ScAD) is a mental illness. It causes symptoms that are a mixture of a psychotic disorder (schizophrenia) and a mood (affective) disorder. A psychotic disorder involves losing touch with reality. ScAD usually occurs in cycles. Periods of severe symptoms may be followed by periods of less severe symptoms or improvement. The illness affects men and women equally, but it usually appears at an earlier age (teenage or early adult years) in men. People who have family members with schizophrenia, bipolar disorder, or ScAD are at higher risk of developing ScAD. ScAD may interfere with personal relationships or normal daily activities. People with ScAD are at a higher risk for:  Job loss.  Social aloneness (isolation).  Health problems.  Anxiety.  Substance use disorders.  Suicide.  What are the causes? The exact cause of this condition is not known. What increases the risk? The following factors may make you more likely to develop this condition:  Problems during your mother's pregnancy and after your birth, such as: ? Your mother having the flu (influenza) during the second semester of the pregnancy. ? Exposure to drugs, alcohol, illnesses, or other poisons (toxins) before birth. ? Low birth weight.  A brain infection or viral infection.  Problems with brain structure or function.  Having family members with bipolar disorder, ScAD, or schizophrenia.  Substance abuse.  Having been diagnosed with a mental health condition in the past.  Being a victim of neglect or long-term (chronic) abuse.  What are the signs or symptoms? At any one time, people with ScAD may have psychotic symptoms only or have both psychotic and affective symptoms. Psychotic symptoms may include:  Hearing, seeing, or feeling things that are not there (hallucinations).  Having fixed, false beliefs (delusions). The delusions usually include being attacked,  harassed, or plotted against (paranoid delusions).  Speaking in a way that makes no sense to others (disorganized speech).  Confusing or odd behavior.  Loss of motivation for normal daily activities, such as self-care.  Withdrawal from social contacts (social isolation).  Lack of emotions.  Affective symptoms may include:  Symptoms similar to major depression, such as: ? Depressed mood. ? Loss of interest in activities that are usually pleasurable (anhedonia). ? Sleeping more or less than normal. ? Feeling worthless or excessively guilty. ? Lack of energy or motivation. ? Trouble concentrating. ? Eating more or less than usual. ? Thinking a lot about death or suicide.  Symptoms similar to bipolar mania. Bipolar mania refers to periods of severe elation, irritability, and high energy that are experienced by people who have bipolar disorder. These symptoms may include: ? Abnormally elevated or irritable mood. ? Abnormally increased energy or activity. ? More confidence than normal or feeling that you are able to do anything (grandiosity). ? Feeling rested after getting less sleep than normal. ? Being easily distracted. ? Talking more than usual or feeling pressure to keep talking. ? Feeling that your thoughts are racing. ? Engaging in high-risk activities.  How is this diagnosed? ScAD is diagnosed through an assessment by your health care provider. Your health care provider may refer you to a mental health specialist for evaluation. The mental health specialist:  Will observe and ask questions about your thoughts, behavior, mood, and ability to function in daily life.  May ask questions about your medical history and use of drugs, including prescription medicines. Certain medical conditions and substances can cause symptoms that resemble ScAD.  May do blood tests and imaging tests.  There are two  types of ScAD:  Depressive ScAD. This type is diagnosed when you have only  depressive symptoms.  Bipolar ScAD. This type is diagnosed if your affective symptoms are only manic or are a mixture of manic and depressive.  How is this treated? ScAD is usually a lifelong (chronic) illness that requires long-term treatment. Treatment may include:  Medicine. Different types of medicine are used to treat ScAD. The exact combination depends on the type and severity of your symptoms. ? Antipsychotic medicine may be used to control psychotic symptoms such as delusions, paranoia, and hallucinations. ? Mood stabilizers may be used to balance the highs and lows of bipolar manic mood swings. ? Antidepressant medicines may be used to treat depressive symptoms.  Counseling or talk therapy. Individual, group, or family counseling may be helpful in providing education, support, and guidance. Many people with ScAD also benefit from social skills and job skills (vocational) training.  A combination of medicine and counseling is usually best for managing the disorder over time. A procedure in which electricity is applied to the brain through the scalp (electroconvulsive therapy) may be used to treat people with severe manic symptoms who do not respond to medicine and counseling. Follow these instructions at home:  Take over-the-counter and prescription medicines only as told by your health care provider. Check with your health care provider before starting new medicines.  Surround yourself with people who care about you and can help you manage your condition.  Keep stress under control. Stress may make symptoms worse.  Avoid alcohol and drugs. They can affect how medicine works and make symptoms worse.  Keep all follow-up visits as told by your health care provider and counselor. This is important. Contact a health care provider if:  You are not able to take your medicines as prescribed.  Your symptoms get worse. Get help right away if:  You feel out of control.  You or others  notice warning signs of suicide, such as: ? Increased use of drugs or alcohol ? Expressing feelings of not having a purpose in life or feeling trapped, guilty, anxious, agitated, or hopeless. ? Withdrawing from friends and family. ? Showing uncontrolled anger, recklessness, and dramatic mood changes. ? Talking about suicide or searching for methods. If you ever feel like you may hurt yourself or others, or have thoughts about taking your own life, get help right away. You can go to your nearest emergency department or call:  Your local emergency services (911 in the [place]  A suicide crisis helpline, such as the [place] at 346-462-4078. This is open 24 hours a day.  Summary  Schizoaffective disorder causes symptoms that are a mixture of a psychotic disorder and a mood disorder.  A combination of medicine and counseling is usually best for managing the disorder over time.  People who have schizoaffective disorder are at risk for suicide. Get help right away if you or someone else notices warning signs of suicide. This information is not intended to replace advice given to you by your health care provider. Make sure you discuss any questions you have with your health care provider. Document Released: 07/15/2006 Document Revised: 12/15/2015 Document Reviewed: 12/15/2015 Elsevier Interactive Patient Education  [employer].

## 2016-11-12 NOTE — Telephone Encounter (Signed)
Noted. We did not speak of a rash today, but noted.

## 2016-11-13 ENCOUNTER — Encounter: Payer: Self-pay | Admitting: Surgery

## 2016-11-13 ENCOUNTER — Ambulatory Visit (INDEPENDENT_AMBULATORY_CARE_PROVIDER_SITE_OTHER): Payer: Medicaid Other | Admitting: Surgery

## 2016-11-13 VITALS — BP 111/77 | HR 84 | Temp 98.3°F | Ht 65.0 in | Wt 194.8 lb

## 2016-11-13 DIAGNOSIS — R2231 Localized swelling, mass and lump, right upper limb: Secondary | ICD-10-CM | POA: Diagnosis not present

## 2016-11-13 NOTE — Patient Instructions (Addendum)
Your Ultrasound is scheduled for 11/25/16 @ 1:20 pm @ [facility]. I will call you with the results.  Please see your follow up appointment listed below.

## 2016-11-14 NOTE — Progress Notes (Signed)
Surgical Consultation  11/14/2016  Marilyn Hurley is an 56 y.o. female.   Chief Complaint  Patient presents with  . New Patient (Initial Visit)    Subdermal Mass Right axilla     HPI: 56 year old female seen in consultation at the request of Dr. Altamease Oiler for right axillary mass. Patient reports that she has been having this mass for several years and over the last few months has increased in size. She also reports some intermittent sharp moderate pain. No fevers no chills Of note the patient had breast augmentation surgery about 30 years ago in [place]. She never had any revisions or any change of implants. Last mammogram personal [place] reviewed showed no evidence of any pathological lesions. She also has some intermittent right upper quadrant pain and a CT scan of the abdomen and pelvis obtained ( personally reviewed) showing evidence of cholelithiasis. She did have an increase in her AST and ALT, normal CBC She does have a history of a schizoaffective disorder and bipolar disorder with panic attacks. He does have a history of chronic back pain and takes Tylenol No. 3  Past Medical History:  Diagnosis Date  . Depression   . Thyroid disease     Past Surgical History:  Procedure Laterality Date  . AUGMENTATION MAMMAPLASTY Bilateral 1989  . breast implant    . CESAREAN SECTION    . MULTIPLE TOOTH EXTRACTIONS      Family History  Problem Relation Age of Onset  . Diabetes Mother   . Heart disease Mother   . Diabetes Sister   . Heart disease Brother     Social History:  reports that she has been smoking Cigarettes.  She has never used smokeless tobacco. She reports that she does not drink alcohol or use drugs.  Allergies:  Allergies  Allergen Reactions  . Ibuprofen   . Keflex [Cephalexin]   . Lamotrigine Rash    Medications reviewed.     ROS Full ROS performed and is otherwise negative other than what is stated in the HPI    BP 111/77   Pulse 84   Temp 98.3 F  (36.8 C) (Oral)   Ht 5\' 5"  (1.651 m)   Wt 88.4 kg (194 lb 12.8 oz)   BMI 32.42 kg/m   Physical Exam  Constitutional: She is oriented to person, place, and time and well-developed, well-nourished, and in no distress. No distress.  HENT:  Head: Normocephalic.  Eyes: Right eye exhibits no discharge. Left eye exhibits no discharge. No scleral icterus.  Neck: Normal range of motion. No JVD present. No tracheal deviation present.  Cardiovascular: Normal rate and regular rhythm.   Pulmonary/Chest: Effort normal. No stridor. No respiratory distress. She has no wheezes. She exhibits no tenderness.  BREAST: There is evidence of bilateral implants without any evidence of infection or contraction. The left axilla is clear and the right axilla shows a soft ill-defined mass consistent with extra mammary tissue. She does have significant sensitivity to both her breast and her axilla   Abdominal: Soft. Bowel sounds are normal. She exhibits no distension. There is no tenderness. There is no rebound and no guarding.  Musculoskeletal: Normal range of motion. She exhibits no edema.  Lymphadenopathy:    She has no cervical adenopathy.  Neurological: She is alert and oriented to person, place, and time. Gait normal. GCS score is 15.  Skin: Skin is warm and dry. She is not diaphoretic.  Psychiatric: Mood, memory, affect and judgment normal.  Nursing note and  vitals reviewed.  Assessment/Plan: 1. Mass of right axilla Discussed with her in detail about the findings and clinically this looks like extramammary tissue. I will also obtain ultrasound of the right breast as well as an ultrasound of the right axilla to rule out any evidence of potential cancer from the breast. I doubt that there is any evidence of cancer since her last mammogram was normal. She tells me that she also has another appointment with a different surgical group for her cholelithiasis. I will see her back in 2 weeks once the further  diagnostic studies are completed.   Caroleen Hamman, MD [facility] General Surgeon

## 2016-11-19 ENCOUNTER — Ambulatory Visit: Payer: Self-pay | Admitting: General Surgery

## 2016-11-19 ENCOUNTER — Telehealth: Payer: Self-pay

## 2016-11-19 NOTE — Telephone Encounter (Signed)
Patient wants to reschedule mammogram due to having surgery 11/25/16. [facility] [phone] number was provided.

## 2016-11-19 NOTE — Telephone Encounter (Signed)
Patient called stating that she needs her Mammogram and [place] reschedule due to having Cholecystectomy on 9/10. Once rescheduled please call patient back with appointment times and dates. She currently has a follow up appointment with Dr. Dahlia Byes on 9/20 which may need to be rescheduled as well if results are not back in time.

## 2016-11-21 NOTE — Pre-Procedure Instructions (Signed)
Marilyn Hurley  11/21/2016      [place], [place] [address] [place] [address] Phone: [phone] Fax: [phone]  [facility] [place], [place] [address] Phone: [phone] Fax: [phone]  [facility] [place], [place] - [address] AT [place] [address] Phone: [phone] Fax: [place], [place] [address] [place] [address] Phone: [phone] Fax: [phone]    Your procedure is scheduled on Monday, Sept. 10th   Report to [facility] Admitting at 10:00 AM             (posted surgery time 12:07p - 1:07p)   Call this number if you have problems the morning of surgery:  [phone], for other questions, call [phone] Mon-Fri from 8a-4p   Remember:              4-5 days prior to surgery, STOP TAKING any Vitamins, Herbal Supplements, Anti-inflammatories, Blood Thinners.   Do not eat food or drink liquids after midnight Sunday.   Take these medicines the morning of surgery with A SIP OF WATER : Klonopin, Levothyroxine, Seroquel   Do not wear jewelry, make-up or nail polish.  Do not wear lotions, powders,  perfumes, or deoderant.             Do not shave 48 hrs prior to the day of surgery.                  Do not bring valuables to the hospital.  [facility] is not responsible for any belongings or valuables.  Contacts, dentures or bridgework may not be worn into surgery.  Leave your suitcase in the car.  After surgery it may be brought to your room.  For patients admitted to the hospital, discharge time will be determined by your treatment team.  Patients discharged the day of surgery will not be allowed to drive home, and will need  someone to stay with you for the first 24 HRS after.  Please read over the following fact sheets that you were given. Pain Booklet and Surgical Site Infection Prevention        [facility]- Preparing For Surgery  Before surgery, you can play an important role. Because skin is not sterile, your skin needs to be as free of germs as possible. You can reduce the number of germs on your skin by washing with CHG (chlorahexidine gluconate) Soap before surgery.  CHG is an antiseptic cleaner which kills germs and bonds with the skin to continue killing germs even after washing.  Please do not use if you have an allergy to CHG or antibacterial soaps. If your skin becomes reddened/irritated stop using the CHG.  Do not shave (including legs and underarms) for at least 48 hours prior to first CHG shower. It is OK to shave your face.  Please follow these instructions carefully.   1. Shower the NIGHT BEFORE SURGERY and the MORNING OF SURGERY with CHG.   2. If you chose to wash your hair, wash your hair first as usual with your normal shampoo.  3. After you shampoo, rinse your hair and body thoroughly to remove the shampoo.  4.  Use CHG as you would any other liquid soap. You can apply CHG directly to the skin and wash gently with a scrungie or a clean washcloth.   5. Apply the CHG Soap to your body ONLY FROM THE NECK DOWN.  Do not use on open wounds or open sores. Avoid contact with your eyes, ears, mouth and genitals (private parts). Wash genitals (private parts) with your normal soap.  6. Wash thoroughly, paying special attention to the area where your surgery will be performed.  7. Thoroughly rinse your body with warm water from the neck down.  8. DO NOT shower/wash with your normal soap after using and rinsing off the CHG Soap.  9. Pat yourself dry with a CLEAN TOWEL.   10. Wear CLEAN PAJAMAS   11. Place CLEAN SHEETS on your bed the night of your first shower and DO NOT SLEEP WITH  PETS.    Day of Surgery: Do not apply any deodorants/lotions. Please wear clean clothes to the hospital/surgery center.

## 2016-11-22 ENCOUNTER — Encounter (HOSPITAL_COMMUNITY)
Admission: RE | Admit: 2016-11-22 | Discharge: 2016-11-22 | Disposition: A | Discharge status: Home / Self Care | Payer: Medicaid Other | Source: Ambulatory Visit | Attending: General Surgery | Admitting: General Surgery

## 2016-11-22 ENCOUNTER — Encounter (HOSPITAL_COMMUNITY): Payer: Self-pay

## 2016-11-22 ENCOUNTER — Telehealth (INDEPENDENT_AMBULATORY_CARE_PROVIDER_SITE_OTHER): Payer: Self-pay | Admitting: Physician Assistant

## 2016-11-22 ENCOUNTER — Encounter (HOSPITAL_COMMUNITY): Payer: Self-pay | Admitting: Vascular Surgery

## 2016-11-22 DIAGNOSIS — Z01818 Encounter for other preprocedural examination: Secondary | ICD-10-CM | POA: Diagnosis not present

## 2016-11-22 DIAGNOSIS — Z01812 Encounter for preprocedural laboratory examination: Secondary | ICD-10-CM | POA: Diagnosis not present

## 2016-11-22 DIAGNOSIS — Z01810 Encounter for preprocedural cardiovascular examination: Secondary | ICD-10-CM | POA: Insufficient documentation

## 2016-11-22 DIAGNOSIS — K8020 Calculus of gallbladder without cholecystitis without obstruction: Secondary | ICD-10-CM | POA: Insufficient documentation

## 2016-11-22 HISTORY — DX: Schizophrenia, unspecified: F20.9

## 2016-11-22 HISTORY — DX: Fatty (change of) liver, not elsewhere classified: K76.0

## 2016-11-22 HISTORY — DX: Hypothyroidism, unspecified: E03.9

## 2016-11-22 HISTORY — DX: Gastro-esophageal reflux disease without esophagitis: K21.9

## 2016-11-22 HISTORY — DX: Bipolar disorder, unspecified: F31.9

## 2016-11-22 HISTORY — DX: Panic disorder (episodic paroxysmal anxiety): F41.0

## 2016-11-22 HISTORY — DX: Polyneuropathy, unspecified: G62.9

## 2016-11-22 HISTORY — DX: Acute stress reaction: F43.0

## 2016-11-22 HISTORY — DX: Other intervertebral disc degeneration, lumbar region: M51.36

## 2016-11-22 LAB — CBC WITH DIFFERENTIAL/PLATELET
Basophils Absolute: 0.1 10*3/uL (ref 0.0–0.1)
Basophils Relative: 1 %
Eosinophils Absolute: 0.4 10*3/uL (ref 0.0–0.7)
Eosinophils Relative: 5 %
HCT: 44.3 % (ref 36.0–46.0)
Hemoglobin: 14.8 g/dL (ref 12.0–15.0)
Lymphocytes Relative: 32 %
Lymphs Abs: 2.5 10*3/uL (ref 0.7–4.0)
MCH: 32.9 pg (ref 26.0–34.0)
MCHC: 33.4 g/dL (ref 30.0–36.0)
MCV: 98.4 fL (ref 78.0–100.0)
Monocytes Absolute: 1 10*3/uL (ref 0.1–1.0)
Monocytes Relative: 13 %
Neutro Abs: 3.8 10*3/uL (ref 1.7–7.7)
Neutrophils Relative %: 49 %
Platelets: 166 10*3/uL (ref 150–400)
RBC: 4.5 MIL/uL (ref 3.87–5.11)
RDW: 12.6 % (ref 11.5–15.5)
WBC: 7.8 10*3/uL (ref 4.0–10.5)

## 2016-11-22 LAB — COMPREHENSIVE METABOLIC PANEL
ALT: 80 U/L — ABNORMAL HIGH (ref 14–54)
AST: 71 U/L — AB (ref 15–41)
Albumin: 3.9 g/dL (ref 3.5–5.0)
Alkaline Phosphatase: 62 U/L (ref 38–126)
Anion gap: 7 (ref 5–15)
BILIRUBIN TOTAL: 0.5 mg/dL (ref 0.3–1.2)
BUN: 10 mg/dL (ref 6–20)
CHLORIDE: 109 mmol/L (ref 101–111)
CO2: 22 mmol/L (ref 22–32)
CREATININE: 0.7 mg/dL (ref 0.44–1.00)
Calcium: 9.1 mg/dL (ref 8.9–10.3)
GFR calc Af Amer: >60 mL/min (ref >60)
Glucose, Bld: 110 mg/dL — ABNORMAL HIGH (ref 65–99)
Potassium: 3.9 mmol/L (ref 3.5–5.1)
Sodium: 138 mmol/L (ref 135–145)
TOTAL PROTEIN: 8.4 g/dL — AB (ref 6.5–8.1)

## 2016-11-22 LAB — PROTIME-INR
INR: 1.03
PROTHROMBIN TIME: 13.4 s (ref 11.4–15.2)

## 2016-11-22 NOTE — Telephone Encounter (Signed)
Marilyn Hurley called to let you know that her Gallbladder surgery was canceled due to EKG Abnormal. she will be here 9/18 @ 8:30am for blood work and drop papers for Disability

## 2016-11-22 NOTE — Progress Notes (Signed)
Anesthesia PAT Evaluation: Patient is a 56 year old female scheduled for laparoscopic cholecystectomy on 11/25/16 by Dr. Judeth Horn.  History includes smoking (5 cig/day, down from 1 PPD), hypothyroidism, GERD, fatty liver, neuropathy, depression, bipolar disorder, panic attacks. Schizophrenia, breast augmentation '89. She recently saw surgeon Dr. Caroleen Hamman on 11/13/16 for evaluation of right axillary mass which he felt was likely extramammary tissue. By notes, he is doubtful it is a cancer, but is ordering an ultrasound and mammogram to further evaluate and will discuss plan of care once she follows up after gallbladder surgery.   PCP is Clent Demark, PA-C at [facility]. Patient has lived in [place] for about a year now, but travels to [place] to visit son and grand kids. She used a transportation service to get to PAT. Cell number [phone].   Meds include clonazepam, levothyroxine, Linzess, Prilosec, Seroquel. She plans to start Chantix after surgery.   BP 113/78   Pulse 87   Temp 36.6 C   Resp 18   Ht 5\' 4"  (1.626 m)   Wt 197 lb 12.8 oz (89.7 kg)   SpO2 99%   BMI 33.95 kg/m   Heart RRR, no murmur noted. Lungs clear. Trace bilateral ankle edema, R > L. Upper dentures. No carotid bruits noted.  Patient is alert, cooperative, in NAD. She denied chest pain currently. She does have chronic abdominal pain with feeling of "bile" in her throat. She says that she has chronic SOB with activities such as walking, bending down. She doesn't feel particularly SOB at rest, but says that for the last few months her friends tell her she is breathing "heavy". She gets mild ankle edema, R > L that she noticed within the year. She also gets daily panic attacks with chest tightness and heart pounding, but these episodes resolve with clonazepam. She was able to walk down the hospital hallway to PAT, but had to move at a slow pace. She is able to do her hours cleaning including vacuuming; however,  she does report that for ~ 1-2 years she develops chest tightness when walking about 1/4-1/3 mile. Tightness will subside with rest. No associated diaphoresis or nausea. She has gotten occasional jaw soreness that was relieved with ASA. 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 She denied known MI or DM history. She does report a strong family history of CAD/MI--her mother had her first CABG at age 35 and had a redo CABG before her death in her 21's. Her father died of an MI when patient was just an infant. Her brother had an MI in his 97's. Her GM and aunt both died from MI's. Patient denied any personal or family history of anesthesia complications. Last had anesthesia with recent colonoscopy.   EKG 11/22/16: NSR, low voltage QRS, cannot rule out inferior infarct (age undetermined). She thinks she had a prior EKG ~ 15 years ago, but that physician is now retired. Currently no comparison EKG tracing in [employer] or [employer]. She reports what sounds like a normal ETT at sub-target HR 18 years ago (said she could not complete the test due to SOB.)  CT abd/pelvis 10/30/16: IMPRESSION: - No acute findings in the abdomen or pelvis. - Prominent gonadal veins in the pelvis bilaterally which can be seen with reflux and pelvic congestion syndrome. - Cholelithiasis. - Aortic atherosclerosis.  Preoperative labs noted. Cr 0.70. Glucose 110. AST 71, ALT 80 (stable since  10/18/16). CBC, PT/INR WNL.   Patient with possible inferior infarct on EKG, although no reported MI history. She does have a strong CAD family history and is also a smoker. Although no current CV symptoms at PAT, she did report  exertional chest tightness. It was somewhat difficult for her to establish a time line, but she thinks it started within the past two years. Her friends also have noticed her breathing is heavier over the last few months. Discussed that if she develops symptoms (ie, chest pain,  jaw pain) that do not resolve with her usual treatment (ie, rest, ASA) or non-exertional symptoms that she should seek immediate medical attention. I have reviewed above with anesthesiologist Dr. Suella Broad. Recommend that PCP refer for stress testing or either have patient referred to cardiology for evaluation prior to surgery. I have notified patient of this and [facility] triage nurse Armen.   George Hugh [facility] Short Stay Center/Anesthesiology Phone [phone] 11/22/2016 12:20 PM

## 2016-11-25 ENCOUNTER — Encounter (HOSPITAL_COMMUNITY): Admission: RE | Payer: Self-pay | Source: Ambulatory Visit

## 2016-11-25 ENCOUNTER — Ambulatory Visit (HOSPITAL_COMMUNITY): Admission: RE | Admit: 2016-11-25 | Payer: Medicaid Other | Source: Ambulatory Visit | Admitting: General Surgery

## 2016-11-25 ENCOUNTER — Telehealth: Payer: Self-pay | Admitting: Physician Assistant

## 2016-11-25 ENCOUNTER — Other Ambulatory Visit: Payer: Self-pay

## 2016-11-25 SURGERY — LAPAROSCOPIC CHOLECYSTECTOMY WITH INTRAOPERATIVE CHOLANGIOGRAM
Anesthesia: General

## 2016-11-25 NOTE — Telephone Encounter (Signed)
Noted  

## 2016-11-25 NOTE — Telephone Encounter (Signed)
Received records from [facility] for appointment on 12/10/16 with Rosaria Ferries, PA.  Records put with Rhonda's schedule for 12/10/16. lp

## 2016-11-26 ENCOUNTER — Telehealth: Payer: Self-pay | Admitting: *Deleted

## 2016-11-26 NOTE — Telephone Encounter (Signed)
Opened in error

## 2016-12-02 ENCOUNTER — Other Ambulatory Visit: Payer: Self-pay

## 2016-12-02 ENCOUNTER — Telehealth: Payer: Self-pay

## 2016-12-02 NOTE — Telephone Encounter (Signed)
Spoke with patient at this time. She stated her transportation could not get her to her appointment's today.  I called to see if she would like to get her Mammogram and follow up appointment with Dr.Pabon scheduled and she stated she wanted to wait until her Cardiologist appointment is over, next Tuesday.  Letter sent to patient at this time to encourage scheduling Mammogram and follow up afterwards with Dr.Pabon.

## 2016-12-03 ENCOUNTER — Telehealth: Payer: Self-pay | Admitting: *Deleted

## 2016-12-03 ENCOUNTER — Other Ambulatory Visit (INDEPENDENT_AMBULATORY_CARE_PROVIDER_SITE_OTHER): Payer: Medicaid Other

## 2016-12-03 DIAGNOSIS — Z13220 Encounter for screening for lipoid disorders: Secondary | ICD-10-CM

## 2016-12-03 NOTE — Telephone Encounter (Signed)
Per Dr. Hulan Fray patient needs colposcopy. She is scheduled for 12/06/16 at [facility]. Attempted to call patient and was unable to leave a message.

## 2016-12-04 ENCOUNTER — Other Ambulatory Visit: Payer: Self-pay | Admitting: *Deleted

## 2016-12-04 ENCOUNTER — Telehealth: Payer: Self-pay | Admitting: Obstetrics & Gynecology

## 2016-12-04 ENCOUNTER — Telehealth: Payer: Self-pay | Admitting: *Deleted

## 2016-12-04 DIAGNOSIS — IMO0002 Reserved for concepts with insufficient information to code with codable children: Secondary | ICD-10-CM

## 2016-12-04 LAB — LIPID PANEL
Chol/HDL Ratio: 6.7 ratio — ABNORMAL HIGH (ref 0.0–4.4)
Cholesterol, Total: 209 mg/dL — ABNORMAL HIGH (ref 100–199)
HDL: 31 mg/dL — ABNORMAL LOW
LDL Calculated: 129 mg/dL — ABNORMAL HIGH (ref 0–99)
Triglycerides: 247 mg/dL — ABNORMAL HIGH (ref 0–149)
VLDL Cholesterol Cal: 49 mg/dL — ABNORMAL HIGH (ref 5–40)

## 2016-12-04 NOTE — Progress Notes (Unsigned)
Dr. 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  Appt scheduled with Dr. Skeet Latch on 9/27 @ 3pm.

## 2016-12-04 NOTE — Telephone Encounter (Signed)
Pt was contacted on 11/11/16. She was notified of abnormal Pap, need for Colpo and appt scheduled on 9/21 @ 0840. No further calls to pt needed.

## 2016-12-04 NOTE — Telephone Encounter (Signed)
Patient called to ask why she had an appointment with a cancer doctor, when nobody had told her she needed to be seen. I informed the patient that I would get a nurse to call her back by the end of the day. Patient stated she needed to know what was going on, and why had she not gotten a phone call prior to being called by Dr. Leone Brand office. I informed the patient that I was not a nurse, and once the nurse speaks to Dr. Hulan Fray she would be giving her a call. Patient was up set, but said okay.

## 2016-12-04 NOTE — Telephone Encounter (Signed)
Pt was called by Dr. Hulan Fray and informed of pathology report which showed Non-invasive Squamous Cell Carcinoma from Lt groin biopsy on 10/28/16.  Referral has been made to Dr. Skeet Latch (Gyn Onc) @ [facility] on 9/27 @ 3pm.  Pt voiced understanding.

## 2016-12-05 ENCOUNTER — Ambulatory Visit: Payer: Medicaid Other | Admitting: Surgery

## 2016-12-06 ENCOUNTER — Other Ambulatory Visit (HOSPITAL_COMMUNITY)
Admission: RE | Admit: 2016-12-06 | Discharge: 2016-12-06 | Disposition: A | Discharge status: Home / Self Care | Payer: Medicaid Other | Source: Ambulatory Visit | Attending: Obstetrics & Gynecology | Admitting: Obstetrics & Gynecology

## 2016-12-06 ENCOUNTER — Telehealth: Payer: Self-pay

## 2016-12-06 ENCOUNTER — Telehealth (INDEPENDENT_AMBULATORY_CARE_PROVIDER_SITE_OTHER): Payer: Self-pay | Admitting: Physician Assistant

## 2016-12-06 ENCOUNTER — Encounter: Payer: Self-pay | Admitting: Obstetrics & Gynecology

## 2016-12-06 ENCOUNTER — Ambulatory Visit (INDEPENDENT_AMBULATORY_CARE_PROVIDER_SITE_OTHER): Payer: Medicaid Other | Admitting: Obstetrics & Gynecology

## 2016-12-06 DIAGNOSIS — R87612 Low grade squamous intraepithelial lesion on cytologic smear of cervix (LGSIL): Secondary | ICD-10-CM | POA: Insufficient documentation

## 2016-12-06 DIAGNOSIS — B977 Papillomavirus as the cause of diseases classified elsewhere: Secondary | ICD-10-CM | POA: Insufficient documentation

## 2016-12-06 LAB — POCT PREGNANCY, URINE: PREG TEST UR: NEGATIVE

## 2016-12-06 NOTE — Telephone Encounter (Signed)
Pt called to request lab result since her GYN told her that her BP is hight, she knows that we don't have the result yet, but she asking to please call her back when we get it, please follow up

## 2016-12-06 NOTE — Telephone Encounter (Signed)
Spoke with patient at this time regarding Mammogram rescheduling. She stated she is having a lot of doctor appointments/biopsies and she is waiting to see what the results are as she may have cervical cancer.  She stated she would let our office know when she gets the mammogram scheduled.

## 2016-12-06 NOTE — Progress Notes (Signed)
   Subjective:    Patient ID: Marilyn Hurley, female    DOB: [birth year], 56 y.o.   MRN: [id number]  HPI 56 yo lady here for a colpo due to LGSIL pap.  Review of Systems     Objective:   Physical Exam UPT negative, consent signed, time out done Cervix prepped with acetic acid. Transformation zone seen in its entirety. Colpo adequate. Entirely normal colpo ECC obtained. She tolerated the procedure well.      Assessment & Plan:   Normal colpo- await ECC

## 2016-12-06 NOTE — Telephone Encounter (Signed)
When results are available patient will receive a call. Nat Christen, CMA

## 2016-12-09 NOTE — Telephone Encounter (Signed)
Error

## 2016-12-09 NOTE — Telephone Encounter (Signed)
Pt called to get the lab result, since her GYN toll her that her Cholesterol is real high, liver is also high, she want the pcp to call her or the nurse to see what we can do

## 2016-12-10 ENCOUNTER — Other Ambulatory Visit: Payer: Self-pay | Admitting: Physician Assistant

## 2016-12-10 ENCOUNTER — Ambulatory Visit (INDEPENDENT_AMBULATORY_CARE_PROVIDER_SITE_OTHER): Payer: Medicaid Other | Admitting: Physician Assistant

## 2016-12-10 ENCOUNTER — Other Ambulatory Visit (INDEPENDENT_AMBULATORY_CARE_PROVIDER_SITE_OTHER): Payer: Self-pay | Admitting: Physician Assistant

## 2016-12-10 ENCOUNTER — Encounter: Payer: Self-pay | Admitting: Physician Assistant

## 2016-12-10 VITALS — BP 104/80 | HR 65 | Ht 64.0 in | Wt 204.0 lb

## 2016-12-10 DIAGNOSIS — Z01818 Encounter for other preprocedural examination: Secondary | ICD-10-CM

## 2016-12-10 DIAGNOSIS — R079 Chest pain, unspecified: Secondary | ICD-10-CM

## 2016-12-10 DIAGNOSIS — E785 Hyperlipidemia, unspecified: Secondary | ICD-10-CM

## 2016-12-10 MED ORDER — PRAVASTATIN SODIUM 40 MG PO TABS: 40.0000 mg | ORAL_TABLET | Freq: Every day | ORAL | 3 refills | Status: DC

## 2016-12-10 MED ORDER — NITROGLYCERIN 0.4 MG SL SUBL: 0.4000 mg | SUBLINGUAL_TABLET | SUBLINGUAL | 0 refills | Status: DC | PRN

## 2016-12-10 NOTE — Progress Notes (Signed)
Cardiology Office Note   Date:  12/10/2016   ID:  Marilyn Hurley, DOB 09-05-1960, MRN [id number]  PCP:  Clent Demark, PA-C  Cardiologist:  Anson [place]   Chief Complaint  Patient presents with  . Follow-up    History of Present Illness: Marilyn Hurley is a 56 y.o. female with a history of smoking (1 cig/day, down from 1 PPD), hypothyroidism, GERD, fatty liver, neuropathy, depression, bipolar disorder, panic attacks. Schizophrenia, breast augmentation '89, strong family history of premature CAD. R axillary mass felt likely extra mammary tissue>>f/u after GB surgery for eval  11/22/2016 preoperative evaluation for laporoscopic cholecystectomy. Patient described dyspnea on exertion, mild ankle edema, chest tightness with prolonged walking.  Marilyn Hurley presents for cardiology evaluation prior to a lap choley  Her last stress test was 18 years ago, she did not get her HR to target.   Her activity is limited by SOB. She will get SOB walking up hills or up steps. She cannot do a whole floor without stopping.   She sometimes gets chest pain. She would get pain in her chest and in her jaw. Pressure, 8/10, Bayer ASA would help both. Last episode was about 3 weeks ago. She walks a dog, but has to stop regularly. She also gets SOB bending over.   She has gained weight, feels it is because of the Seroquel. She is down to 1/2 cigarette per day, on Chantix. She is eating less, but has not lost any weight.   She had an abnormal skin biopsy, ?squamous cell carcinoma. She needs to see an oncologist next week.   She has problems with her GB, needs it out ASAP.    Past Medical History:  Diagnosis Date  . Bipolar disorder ([place])   . DDD (degenerative disc disease), lumbar    BACK+ NECK  . Depression   . Fatty liver   . GERD (gastroesophageal reflux disease)   . Hypothyroidism   . Neuropathy   . Panic attack as reaction to stress   . Schizophrenia ([place])   . Thyroid  disease     Past Surgical History:  Procedure Laterality Date  . AUGMENTATION MAMMAPLASTY Bilateral 1989  . breast implant    . CESAREAN SECTION    . MULTIPLE TOOTH EXTRACTIONS      Current Outpatient Prescriptions  Medication Sig Dispense Refill  . clonazePAM (KLONOPIN) 1 MG tablet Take 1 tablet (1 mg total) by mouth 2 (two) times daily as needed for anxiety. 60 tablet 0  . levothyroxine (SYNTHROID, LEVOTHROID) 150 MCG tablet Take 1 tablet (150 mcg total) by mouth daily. (Patient taking differently: Take 150 mcg by mouth daily before breakfast. ) 90 tablet 3  . linaclotide (LINZESS) 145 MCG CAPS capsule Take 145 mcg by mouth daily before breakfast.    . omeprazole (PRILOSEC) 40 MG capsule Take 40 mg by mouth daily.    . QUEtiapine (SEROQUEL) 50 MG tablet Take 50-100 mg by mouth 3 (three) times daily. Take 1 tablet (50 mg) twice daily in the morning & afternoon, take 2 tablets (100 mg) by mouth at bedtime.  0  . varenicline (CHANTIX STARTING MONTH PAK) 0.5 MG X 11 & 1 MG X 42 tablet Take one 0.5 mg tablet by mouth once daily for 3 days, then increase to one 0.5 mg tablet twice daily for 4 days, then increase to one 1 mg tablet twice daily. 53 tablet 0   No current facility-administered medications for this visit.  Allergies:   Ibuprofen; Keflex [cephalexin]; and Lamotrigine    Social History:  The patient  reports that she has been smoking Cigarettes.  She has never used smokeless tobacco. She reports that she does not drink alcohol or use drugs.   Family History:  The patient's family history includes Diabetes in her mother and sister; Heart attack in her brother, father, and paternal grandmother; Heart disease in her brother; Heart disease (age of onset: 74) in her mother.    ROS:  Please see the history of present illness. All other systems are reviewed and negative.    PHYSICAL EXAM: VS:  BP 104/80   Pulse 65   Ht 5\' 4"  (1.626 m)   Wt 204 lb (92.5 kg)   BMI 35.02 kg/m   , BMI Body mass index is 35.02 kg/m. GEN: Well nourished, well developed, female in no acute distress  HEENT: normal for age  Neck: no JVD, no carotid bruit, no masses Cardiac: RRR; soft murmur, no rubs, or gallops Respiratory:  clear to auscultation bilaterally, normal work of breathing GI: soft, nontender, nondistended, + BS MS: no deformity or atrophy; no edema; distal pulses are 2+ in all 4 extremities   Skin: warm and dry, no rash Neuro:  Strength and sensation are intact Psych: euthymic mood, full affect   EKG:  EKG is ordered today. The ekg ordered today demonstrates SR, HR 65, inferior Q waves, no old ECGs    Recent Labs: 10/18/2016: TSH 15.400 11/22/2016: ALT 80; BUN 10; Creatinine, Ser 0.70; Hemoglobin 14.8; Platelets 166; Potassium 3.9; Sodium 138    Lipid Panel    Component Value Date/Time   CHOL 209 (H) 12/03/2016 1043   TRIG 247 (H) 12/03/2016 1043   HDL 31 (L) 12/03/2016 1043   CHOLHDL 6.7 (H) 12/03/2016 1043   LDLCALC 129 (H) 12/03/2016 1043     Wt Readings from Last 3 Encounters:  12/10/16 204 lb (92.5 kg)  12/06/16 204 lb 3.2 oz (92.6 kg)  11/22/16 197 lb 12.8 oz (89.7 kg)     Other studies Reviewed: Additional studies/ records that were reviewed today include: office notes, testing.  ASSESSMENT AND PLAN:The patient was reviewed with Dr. Sallyanne Kuster who determined the plan.  1.  Preop evaluation: She has significant CRFs with HLD, tob use and strong FH CAD. She is having consistent DOE and exertional CP. The DOE comes first, then the chest pain.   Explained that I felt some testing is needed. The present cons of cardiac catheterization versus stress testing were reviewed with Dr. Sallyanne Kuster.  At this time, because she needs her gallbladder out and treatment for the abnormal skin lesion, we will do a stress test. If that is low risk, she is at acceptable risk for the planned surgery. We will try to get that later this week and follow-up with her early next  week, either in person or by phone. If the stress test is high risk, she will need a cardiac catheterization, possibly prior to the gallbladder surgery.   Will give her sublingual nitroglycerin to use as needed, but she will have to be careful because of her borderline blood pressure.   Current medicines are reviewed at length with the patient today.  The patient does not have concerns regarding medicines.  The following changes have been made:  no change  Labs/ tests ordered today include:  No orders of the defined types were placed in this encounter.    Disposition:   FU with  Dr.  Croitoru  Signed, Lenoard Aden  12/10/2016 2:37 PM    [place] Phone: [phone]; Fax: [phone]  This note was written with the assistance of speech recognition software. Please excuse any transcriptional errors.

## 2016-12-10 NOTE — Patient Instructions (Signed)
Medication Instructions:  MAY TAKE NITRO-SL FOR CHEST PAIN-MAKE SURE SOMEONE IS WITH YOU -OR- YOU ARE SITTING DOWN BEFORE TAKING. If you need a refill on your cardiac medications before your next appointment, please call your pharmacy.  Testing/Procedures: Your physician has requested that you have a lexiscan myoview. For further information please visit [email or web address]. Please follow instruction sheet, as given.   Follow-Up: Your physician wants you to follow-up with: [facility] Wednesday October 3RD AT 2PM.   Special Instructions: WE WILL CALL YOU WITH THE RESULTS OF THE STRESS TEST   Thank you for choosing [facility] at [facility]!!

## 2016-12-11 ENCOUNTER — Telehealth: Payer: Self-pay

## 2016-12-11 ENCOUNTER — Telehealth (INDEPENDENT_AMBULATORY_CARE_PROVIDER_SITE_OTHER): Payer: Self-pay

## 2016-12-11 ENCOUNTER — Other Ambulatory Visit (INDEPENDENT_AMBULATORY_CARE_PROVIDER_SITE_OTHER): Payer: Self-pay | Admitting: Physician Assistant

## 2016-12-11 DIAGNOSIS — E785 Hyperlipidemia, unspecified: Secondary | ICD-10-CM

## 2016-12-11 MED ORDER — PRAVASTATIN SODIUM 40 MG PO TABS: 40.0000 mg | ORAL_TABLET | Freq: Every day | ORAL | 3 refills | Status: AC

## 2016-12-11 NOTE — Telephone Encounter (Signed)
REFILL 

## 2016-12-11 NOTE — Telephone Encounter (Signed)
-----   Message from Clent Demark, PA-C sent at 12/10/2016  5:53 PM EDT ----- Katherene Ponto and LDL elevated. I have sent out statin to help lower cholesterol.

## 2016-12-11 NOTE — Telephone Encounter (Signed)
Left patient a message to call office for lab results. Nat Christen, CMA

## 2016-12-11 NOTE — Telephone Encounter (Signed)
-----   Message from Emily Filbert, MD sent at 12/11/2016  4:37 PM EDT ----- She will need a pap with cotesting in a year. Thanks

## 2016-12-11 NOTE — Telephone Encounter (Signed)
Called patient to inform her of test results and need to have a pap smear with co-testing in 1 year. No answer or voice mail to leave a message.

## 2016-12-12 ENCOUNTER — Encounter: Payer: Self-pay | Admitting: Gynecologic Oncology

## 2016-12-12 ENCOUNTER — Telehealth (HOSPITAL_COMMUNITY): Payer: Self-pay

## 2016-12-12 ENCOUNTER — Ambulatory Visit: Payer: Medicaid Other | Attending: Gynecologic Oncology | Admitting: Gynecologic Oncology

## 2016-12-12 ENCOUNTER — Other Ambulatory Visit (INDEPENDENT_AMBULATORY_CARE_PROVIDER_SITE_OTHER): Payer: Self-pay | Admitting: Physician Assistant

## 2016-12-12 VITALS — BP 118/85 | HR 64 | Temp 98.3°F | Resp 20 | Ht 64.0 in | Wt 205.7 lb

## 2016-12-12 DIAGNOSIS — Z833 Family history of diabetes mellitus: Secondary | ICD-10-CM | POA: Diagnosis not present

## 2016-12-12 DIAGNOSIS — Z8249 Family history of ischemic heart disease and other diseases of the circulatory system: Secondary | ICD-10-CM | POA: Insufficient documentation

## 2016-12-12 DIAGNOSIS — Z79899 Other long term (current) drug therapy: Secondary | ICD-10-CM | POA: Insufficient documentation

## 2016-12-12 DIAGNOSIS — D071 Carcinoma in situ of vulva: Secondary | ICD-10-CM | POA: Insufficient documentation

## 2016-12-12 DIAGNOSIS — F209 Schizophrenia, unspecified: Secondary | ICD-10-CM | POA: Insufficient documentation

## 2016-12-12 DIAGNOSIS — F17210 Nicotine dependence, cigarettes, uncomplicated: Secondary | ICD-10-CM | POA: Insufficient documentation

## 2016-12-12 DIAGNOSIS — F319 Bipolar disorder, unspecified: Secondary | ICD-10-CM | POA: Diagnosis not present

## 2016-12-12 DIAGNOSIS — Z9889 Other specified postprocedural states: Secondary | ICD-10-CM | POA: Insufficient documentation

## 2016-12-12 DIAGNOSIS — R87618 Other abnormal cytological findings on specimens from cervix uteri: Secondary | ICD-10-CM

## 2016-12-12 NOTE — Telephone Encounter (Signed)
Encounter complete. 

## 2016-12-12 NOTE — Progress Notes (Signed)
Consult Note: Gyn-Onc  Consult was requested by Dr. Hulan Fray for the evaluation of Marilyn Hurley 56 y.o. female  CC: VIN 3  Assessment/Plan:  Ms. 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  The area is healing well and no residual disease was grossly identified. Recommend follow-up in 6 months. If there is evidence of persistent dysplasia on colposcopy will recommend CO2 laser.  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 Hulan Fray for further evaluation  HPI: Ms. Marilyn Hurley is a 56 y.o.  who presented to Dr. Hulan Fray and requested assessment of numerous skin tags present in the external genitalia. Biopsies were obtained on the right labia majora in 3 areas and then skin tags in the left vulva crease were excised. This returned with a pathologic diagnosis of noninvasive squamous cell carcinoma one edge of resection margins positive for squamous dysplasia. The patient reports some discomfort at the site of the area but denies pruritus or bleeding. Her history is notable for tobacco use since the age of 70 and she reports an total 48 years of 3 packs per day of tobacco use.  She quit 12 days ago   Of note Pap test on 10/28/2016 returned low-grade SIL HPV positive.  Review of Systems:  Constitutional  Feels well,  Cardiovascular  No chest pain, shortness of breath, or edema  Pulmonary  No cough or wheeze.  Gastro Intestinal  Nausea, attributable to cholelithiasis , no vomitting, or diarrhoea. No bright red blood per rectum, per abdominal discomfort.  No change in bowel movement, or constipation.  Genito Urinary  No frequency, urgency, dysuria, Musculo Skeletal  No myalgia, arthralgia, joint swelling or pain  Neurologic  No weakness, numbness, change in gait,  Psychology  No depression, anxiety, insomnia.    Current Meds:  Outpatient Encounter  Prescriptions as of 12/12/2016  Medication Sig  . clonazePAM (KLONOPIN) 1 MG tablet Take 1 tablet (1 mg total) by mouth 2 (two) times daily as needed for anxiety.  Marland Kitchen levothyroxine (SYNTHROID, LEVOTHROID) 150 MCG tablet Take 1 tablet (150 mcg total) by mouth daily. (Patient taking differently: Take 150 mcg by mouth daily before breakfast. )  . linaclotide (LINZESS) 145 MCG CAPS capsule Take 145 mcg by mouth daily before breakfast.  . nitroGLYCERIN (NITROSTAT) 0.4 MG SL tablet DISSOLVE 1 TABLET UNDER THE TONGUE EVERY 5 MINUTES AS NEEDED FOR CHEST PAIN  . omeprazole (PRILOSEC) 40 MG capsule Take 40 mg by mouth daily.  . pravastatin (PRAVACHOL) 40 MG tablet Take 1 tablet (40 mg total) by mouth daily.  . QUEtiapine (SEROQUEL) 50 MG tablet Take 50-100 mg by mouth 3 (three) times daily. Take 1 tablet (50 mg) twice daily in the morning & afternoon, take 2 tablets (100 mg) by mouth at bedtime.  . varenicline (CHANTIX STARTING MONTH PAK) 0.5 MG X 11 & 1 MG X 42 tablet Take one 0.5 mg tablet by mouth once daily for 3 days, then increase to one 0.5 mg tablet twice daily for 4 days, then increase to one 1 mg tablet twice daily.   No facility-administered encounter medications on file as of 12/12/2016.     Allergy:  Allergies  Allergen Reactions  . Ibuprofen Hives  . Keflex [Cephalexin] Hives  . Lamotrigine Rash    Social Hx:   Social History   Social History  . Marital status: Single  Spouse name: N/A  . Number of children: N/A  . Years of education: N/A   Occupational History  . Not on file.   Social History Main Topics  . Smoking status: Current Some Day Smoker    Types: Cigarettes  . Smokeless tobacco: Never Used  . Alcohol use No  . Drug use: No  . Sexual activity: No   Other Topics Concern  . Not on file   Social History Narrative  . No narrative on file    Past Surgical Hx:  Past Surgical History:  Procedure Laterality Date  . AUGMENTATION MAMMAPLASTY Bilateral 1989  .  breast implant    . CESAREAN SECTION    . MULTIPLE TOOTH EXTRACTIONS      Past Medical Hx:  Past Medical History:  Diagnosis Date  . Bipolar disorder ([place])   . DDD (degenerative disc disease), lumbar    BACK+ NECK  . Depression   . Fatty liver   . GERD (gastroesophageal reflux disease)   . Hypothyroidism   . Neuropathy   . Panic attack as reaction to stress   . Schizophrenia ([place])   . Thyroid disease     Past Gynecological History:  Gravida 1 para 1  Family Hx:  Family History  Problem Relation Age of Onset  . Diabetes Mother   . Heart disease Mother 61       CABG and redo, died in her 71s  . Diabetes Sister   . Heart disease Brother   . Heart attack Brother        67s  . Heart attack Father        Died when she was a baby  . Heart attack Paternal Grandmother     Vitals:  Blood pressure 118/85, pulse 64, temperature 98.3 F (36.8 C), temperature source Oral, resp. rate 20, height 5\' 4"  (1.626 m), weight 205 lb 11.2 oz (93.3 kg), SpO2 99 %.  Physical Exam: WD in NAD Neck  Supple NROM, without any enlargements.  Lymph Node Survey No cervical supraclavicular or inguinal adenopathy Cardiovascular  Pulse normal rate, regularity and rhythm. S1 and S2 normal.  Lungs  Clear to auscultation bilaterally, without wheezes/crackles/rhonchi. Good air movement.  Skin  No rash/lesions/breakdown  Psychiatry  Alert and oriented appropriate mood affect speech and reasoning. Abdomen  Normoactive bowel sounds, abdomen soft, upper quadrant discomfort, Vertical incision intact without evidence of hernia.  Back No CVA tenderness Genito Urinary  Vulva/vagina: Normal external female genitalia.  Biopsy sites healing well No discharge or bleeding. Extremities  No bilateral cyanosis, clubbing or edema.   Janie Morning, MD, PhD 12/12/2016, 3:37 PM

## 2016-12-12 NOTE — Patient Instructions (Signed)
Plan to follow up in six months or sooner if needed.  Please call in Nov or Dec 2018 to [phone] to schedule your appointment for Feb 2019.  Please call for any needs or concerns.

## 2016-12-12 NOTE — Telephone Encounter (Signed)
Keep her appointments as indicated for detailed explanations as I always do.

## 2016-12-13 NOTE — Telephone Encounter (Signed)
Patient inform of lab results and need to come back in a year.

## 2016-12-17 ENCOUNTER — Telehealth: Payer: Self-pay | Admitting: Obstetrics & Gynecology

## 2016-12-17 ENCOUNTER — Ambulatory Visit (HOSPITAL_COMMUNITY)
Admission: RE | Admit: 2016-12-17 | Discharge: 2016-12-17 | Disposition: A | Discharge status: Home / Self Care | Payer: Medicaid Other | Source: Ambulatory Visit | Attending: Cardiovascular Disease | Admitting: Cardiovascular Disease

## 2016-12-17 DIAGNOSIS — R079 Chest pain, unspecified: Secondary | ICD-10-CM

## 2016-12-17 MED ORDER — TECHNETIUM TC 99M TETROFOSMIN IV KIT
10.1000 | PACK | Freq: Once | INTRAVENOUS | Status: AC | PRN
  Administered 2016-12-17: 10.1 via INTRAVENOUS
  Filled 2016-12-17: qty 11

## 2016-12-17 MED ORDER — TECHNETIUM TC 99M TETROFOSMIN IV KIT
30.1000 | PACK | Freq: Once | INTRAVENOUS | Status: AC | PRN
  Administered 2016-12-17: 30.1 via INTRAVENOUS
  Filled 2016-12-17: qty 31

## 2016-12-17 MED ORDER — REGADENOSON 0.4 MG/5ML IV SOLN
0.4000 mg | Freq: Once | INTRAVENOUS | Status: AC
  Administered 2016-12-17: 0.4 mg via INTRAVENOUS

## 2016-12-17 NOTE — Telephone Encounter (Signed)
Patient need a Refill on her Chantix  Pharmacy Phone Number [phone]

## 2016-12-18 ENCOUNTER — Ambulatory Visit (INDEPENDENT_AMBULATORY_CARE_PROVIDER_SITE_OTHER): Payer: Medicaid Other | Admitting: Physician Assistant

## 2016-12-18 ENCOUNTER — Telehealth: Payer: Self-pay | Admitting: Physician Assistant

## 2016-12-18 ENCOUNTER — Encounter: Payer: Self-pay | Admitting: Physician Assistant

## 2016-12-18 VITALS — BP 117/80 | HR 64 | Ht 64.0 in | Wt 203.8 lb

## 2016-12-18 DIAGNOSIS — R200 Anesthesia of skin: Secondary | ICD-10-CM | POA: Diagnosis not present

## 2016-12-18 DIAGNOSIS — Z01818 Encounter for other preprocedural examination: Secondary | ICD-10-CM

## 2016-12-18 DIAGNOSIS — R202 Paresthesia of skin: Secondary | ICD-10-CM | POA: Diagnosis not present

## 2016-12-18 DIAGNOSIS — R0609 Other forms of dyspnea: Secondary | ICD-10-CM | POA: Diagnosis not present

## 2016-12-18 DIAGNOSIS — R079 Chest pain, unspecified: Secondary | ICD-10-CM

## 2016-12-18 LAB — MYOCARDIAL PERFUSION IMAGING
CHL CUP NUCLEAR SDS: 3
CHL CUP NUCLEAR SRS: 1
CHL CUP NUCLEAR SSS: 4
CSEPPHR: 57 {beats}/min
LV dias vol: 74 mL (ref 46–106)
LV sys vol: 26 mL
Rest HR: 59 {beats}/min
TID: 1.13

## 2016-12-18 NOTE — Patient Instructions (Addendum)
Medication Instructions:  NO CHANGES If you need a refill on your cardiac medications before your next appointment, please call your pharmacy.  Follow-Up: Your physician wants you to follow-up: AS NEEDED WITH DR [id number].   Special Instructions: ACCEPTABLE RISK FOR PLANNED PROCEDURE  PLEASE CALL BACK WITH MD PHONE NUMBER  OUR FAX NUMBER IS [phone] ATTN:MICHELLE OR RHONDA BARRETT, PA-C    Thank you for choosing [facility] at [facility]!!

## 2016-12-18 NOTE — Progress Notes (Signed)
Cardiology Office Note   Date:  12/18/2016   ID:  Marilyn Hurley, DOB 06-26-1960, MRN [id number]  PCP:  Clent Demark, PA-C  Cardiologist:  Dr. Sallyanne Kuster, responsible physician at office visit 12/10/2016  Lenoard Aden 12/10/2016  Chief Complaint  Patient presents with  . Chest Pain    History of Present Illness: [place] Marilyn Hurley is a 56 y.o. female with a history of smoking (1 cig/day, down from 1 PPD), hypothyroidism, GERD, fatty liver, neuropathy, depression, bipolar disorder, panic attacks. Schizophrenia, breast augmentation '89, strong family history of premature CAD. R axillary mass felt likely extra mammary tissue>>f/u after GB surgery for eval.  09/25 office visit, evaluation prior to laparoscopic cholecystectomy, CP and DOE concerning and stress test ordered.  Marilyn Hurley presents for cardiology follow up.  During the MV, she had headaches and SOB, chest pressure. The sx resolved after the test was over. She has had these sx on/off through the years. No obvious cause, not exertional. Associated with SOB. The episodes last up to 5 minutes. She gets these 4-5 x week. Has been getting them for years.   Today, she got an episode of chest pain that started when she pulled into the parking lot. The heaviness resolved in its usual time frame. ECG was unchanged. She also got paresthesias in her L face. That has not resolved. She has not had that before. She can move her space and does not feel restricted in any way. However, it just feels like it is halfway asleep on the left side.  She feels she is retaining water in her hands and arms. No LE edema, except sometimes during the day. Denies orthopnea or PND. She still feels the DOE, says her friends notice it when she talks on the phone.   She has not smoked in 22 days.   Past Medical History:  Diagnosis Date  . Bipolar disorder ([place])   . DDD (degenerative disc disease), lumbar    BACK+ NECK  . Depression   . Fatty  liver   . GERD (gastroesophageal reflux disease)   . Hypothyroidism   . Neuropathy   . Panic attack as reaction to stress   . Schizophrenia ([place])   . Thyroid disease     Past Surgical History:  Procedure Laterality Date  . AUGMENTATION MAMMAPLASTY Bilateral 1989  . breast implant    . CESAREAN SECTION    . MULTIPLE TOOTH EXTRACTIONS      Current Outpatient Prescriptions  Medication Sig Dispense Refill  . clonazePAM (KLONOPIN) 1 MG tablet Take 1 tablet (1 mg total) by mouth 2 (two) times daily as needed for anxiety. 60 tablet 0  . levothyroxine (SYNTHROID, LEVOTHROID) 150 MCG tablet Take 1 tablet (150 mcg total) by mouth daily. (Patient taking differently: Take 150 mcg by mouth daily before breakfast. ) 90 tablet 3  . linaclotide (LINZESS) 145 MCG CAPS capsule Take 145 mcg by mouth daily before breakfast.    . nitroGLYCERIN (NITROSTAT) 0.4 MG SL tablet DISSOLVE 1 TABLET UNDER THE TONGUE EVERY 5 MINUTES AS NEEDED FOR CHEST PAIN 75 tablet 3  . omeprazole (PRILOSEC) 40 MG capsule Take 40 mg by mouth daily.    . pravastatin (PRAVACHOL) 40 MG tablet Take 1 tablet (40 mg total) by mouth daily. 90 tablet 3  . QUEtiapine (SEROQUEL) 50 MG tablet Take 1 tablet  Orally (50 mg)  in the morning & afternoon, take 2 tablets (100 mg) by mouth at bedtime.  0  . varenicline (  CHANTIX STARTING MONTH PAK) 0.5 MG X 11 & 1 MG X 42 tablet Take one 0.5 mg tablet by mouth once daily for 3 days, then increase to one 0.5 mg tablet twice daily for 4 days, then increase to one 1 mg tablet twice daily. 53 tablet 0   No current facility-administered medications for this visit.     Allergies:   Ibuprofen; Keflex [cephalexin]; and Lamotrigine    Social History:  The patient  reports that she quit smoking about 3 weeks ago. Her smoking use included Cigarettes. She has never used smokeless tobacco. She reports that she does not drink alcohol or use drugs.   Family History:  The patient's family history includes  Diabetes in her mother and sister; Heart attack in her brother, father, and paternal grandmother; Heart disease in her brother; Heart disease (age of onset: 49) in her mother.    ROS:  Please see the history of present illness. All other systems are reviewed and negative.    PHYSICAL EXAM: VS:  BP 117/80   Pulse 64   Ht 5\' 4"  (1.626 m)   Wt 203 lb 12.8 oz (92.4 kg)   BMI 34.98 kg/m  , BMI Body mass index is 34.98 kg/m. GEN: Well nourished, well developed, female in no acute distress  HEENT: normal for age  Neck: no JVD, no carotid bruit, no masses Cardiac: RRR; soft murmur, no rubs, or gallops Respiratory:  clear to auscultation bilaterally, normal work of breathing GI: soft, nontender, nondistended, + BS MS: no deformity or atrophy; no edema; distal pulses are 2+ in all 4 extremities   Skin: warm and dry, no rash Neuro:  Strength and sensation are intact; CN II-XII grossly intact Psych: euthymic mood, full affect   EKG:  EKG is ordered today. The ekg ordered today demonstrates SR, low voltage, no sig change from 12/10/2016 (obtained during chest pain episode.   Recent Labs: 10/18/2016: TSH 15.400 11/22/2016: ALT 80; BUN 10; Creatinine, Ser 0.70; Hemoglobin 14.8; Platelets 166; Potassium 3.9; Sodium 138    Lipid Panel    Component Value Date/Time   CHOL 209 (H) 12/03/2016 1043   TRIG 247 (H) 12/03/2016 1043   HDL 31 (L) 12/03/2016 1043   CHOLHDL 6.7 (H) 12/03/2016 1043   LDLCALC 129 (H) 12/03/2016 1043     Wt Readings from Last 3 Encounters:  12/18/16 203 lb 12.8 oz (92.4 kg)  12/17/16 204 lb (92.5 kg)  12/12/16 205 lb 11.2 oz (93.3 kg)     Other studies Reviewed: Additional studies/ records that were reviewed today include: office notes and testing.  ASSESSMENT AND PLAN:The patient and plan were reviewed with Dr. Claiborne Billings who agrees  1.  Chest pain: sx are recurrent but not exertional. MV performed 10/02, not read yet, but EF is normal. Dr. Claiborne Billings reviewed the images  and does not feel there is any obvious scar or ischemia.   2. Facial numbness: CN II-XII grossly intact. No visible deficits and patient does have sensation in her face although she states it feels a little different when it is touched. She is encouraged to follow-up with her PCP with this. She does not wish to get a neurology referral right now.  3. Dyspnea on exertion: Her symptoms have not changed recently. Her EF is normal. She does not have volume overload by exam. I feel there is an element of deconditioning. She admits that she has gained a lot of weight and feels this is affecting her as well.  She also smoked from the age of 8 until 22 days ago. She is encouraged to increase her activity as tolerated. I advised that we could get an echocardiogram, or pulmonary function tests to further evaluate this, but she does not wish to do this right now.  4. Tobacco use: She is tolerating the Chantix and has not smoked in over 3 weeks. She thinks she will be able to stay off cigarettes.  5. Preoperative evaluation: She is at acceptable risk for the planned procedure. No further cardiac evaluation needs to be done.   Current medicines are reviewed at length with the patient today.  The patient does not have concerns regarding medicines.  The following changes have been made:  no change  Labs/ tests ordered today include:  No orders of the defined types were placed in this encounter.    Disposition:   FU with Dr. Sallyanne Kuster  Signed, Rosaria Ferries, PA-C  12/18/2016 1:58 PM    [facility] Phone: [phone]; Fax: [phone]  This note was written with the assistance of speech recognition software. Please excuse any transcriptional errors.

## 2016-12-18 NOTE — Progress Notes (Signed)
Nuclear images look normal to me. I wonder why it is not read yet MCr

## 2016-12-18 NOTE — Telephone Encounter (Signed)
New Message     Surgeon DR Fidela Juneau  [phone]  Fax [phone]

## 2016-12-19 NOTE — Telephone Encounter (Signed)
Called DR Fidela Juneau  [phone] they want to fax to attn:Victorious Cosio  Fax sent via [facility]

## 2016-12-19 NOTE — Telephone Encounter (Signed)
Tried to call DR Fidela Juneau office-office closed will call later

## 2016-12-23 ENCOUNTER — Telehealth: Payer: Self-pay

## 2016-12-23 NOTE — Telephone Encounter (Signed)
Spoke with patient at this time in regards to rescheduling Mammogram.  Patient stated she is having a Cholecystectomy on 01/26/17 Dr.white.  She stated she does not want to have Mammogram until after the 1st of the year.  She will be going to [place] for the holidays and will call when she is ready to have it.

## 2016-12-24 ENCOUNTER — Encounter (HOSPITAL_COMMUNITY): Payer: Self-pay | Admitting: *Deleted

## 2016-12-24 NOTE — Telephone Encounter (Signed)
Chantix was prescribed by patient's PCP.

## 2016-12-25 ENCOUNTER — Encounter (HOSPITAL_COMMUNITY): Payer: Self-pay | Admitting: Certified Registered"

## 2016-12-25 NOTE — Anesthesia Preprocedure Evaluation (Addendum)
Anesthesia Evaluation  Patient identified by MRN, date of birth, ID band Patient awake    Reviewed: Allergy & Precautions, NPO status , Patient's Chart, lab work & pertinent test results  Airway Mallampati: II  TM Distance: >3 FB Neck ROM: Full    Dental  (+) Edentulous Upper, Edentulous Lower   Pulmonary Current Smoker,    Pulmonary exam normal breath sounds clear to auscultation       Cardiovascular Exercise Tolerance: Good Normal cardiovascular exam Rhythm:Regular Rate:Normal  Nuc Stress 12/2016 - The left ventricular ejection fraction is normal (55-65%). There was no ST segment deviation noted during stress. The study is normal.   Neuro/Psych PSYCHIATRIC DISORDERS Anxiety Depression Bipolar Disorder Schizophrenia negative neurological ROS     GI/Hepatic Neg liver ROS, GERD  Controlled,  Endo/Other  Hypothyroidism Obesity  Renal/GU negative Renal ROS  negative genitourinary   Musculoskeletal  (+) Arthritis ,   Abdominal   Peds  Hematology negative hematology ROS (+)   Anesthesia Other Findings   Reproductive/Obstetrics                            Anesthesia Physical Anesthesia Plan  ASA: II  Anesthesia Plan: General   Post-op Pain Management:    Induction: Intravenous  PONV Risk Score and Plan: 4 or greater and Ondansetron, Dexamethasone, Midazolam, Scopolamine patch - Pre-op and Treatment may vary due to age or medical condition  Airway Management Planned: Oral ETT  Additional Equipment: None  Intra-op Plan:   Post-operative Plan: Extubation in OR  Informed Consent: I have reviewed the patients History and Physical, chart, labs and discussed the procedure including the risks, benefits and alternatives for the proposed anesthesia with the patient or authorized representative who has indicated his/her understanding and acceptance.   Dental advisory given  Plan Discussed  with: CRNA  Anesthesia Plan Comments:         Anesthesia Quick Evaluation

## 2016-12-26 ENCOUNTER — Ambulatory Visit (HOSPITAL_COMMUNITY)
Admission: RE | Admit: 2016-12-26 | Discharge: 2016-12-26 | Disposition: A | Discharge status: Home / Self Care | Payer: Medicaid Other | Source: Ambulatory Visit | Attending: General Surgery | Admitting: General Surgery

## 2016-12-26 ENCOUNTER — Ambulatory Visit (HOSPITAL_COMMUNITY): Payer: Medicaid Other | Admitting: Certified Registered"

## 2016-12-26 ENCOUNTER — Ambulatory Visit (HOSPITAL_COMMUNITY): Payer: Medicaid Other

## 2016-12-26 ENCOUNTER — Encounter (HOSPITAL_COMMUNITY)
Admission: RE | Disposition: A | Discharge status: Home / Self Care | Payer: Self-pay | Source: Ambulatory Visit | Attending: General Surgery

## 2016-12-26 DIAGNOSIS — Z833 Family history of diabetes mellitus: Secondary | ICD-10-CM | POA: Insufficient documentation

## 2016-12-26 DIAGNOSIS — Z886 Allergy status to analgesic agent status: Secondary | ICD-10-CM | POA: Insufficient documentation

## 2016-12-26 DIAGNOSIS — F17200 Nicotine dependence, unspecified, uncomplicated: Secondary | ICD-10-CM | POA: Insufficient documentation

## 2016-12-26 DIAGNOSIS — Z818 Family history of other mental and behavioral disorders: Secondary | ICD-10-CM | POA: Insufficient documentation

## 2016-12-26 DIAGNOSIS — F329 Major depressive disorder, single episode, unspecified: Secondary | ICD-10-CM | POA: Diagnosis not present

## 2016-12-26 DIAGNOSIS — K219 Gastro-esophageal reflux disease without esophagitis: Secondary | ICD-10-CM | POA: Insufficient documentation

## 2016-12-26 DIAGNOSIS — Z881 Allergy status to other antibiotic agents status: Secondary | ICD-10-CM | POA: Diagnosis not present

## 2016-12-26 DIAGNOSIS — K8010 Calculus of gallbladder with chronic cholecystitis without obstruction: Secondary | ICD-10-CM | POA: Insufficient documentation

## 2016-12-26 DIAGNOSIS — Z419 Encounter for procedure for purposes other than remedying health state, unspecified: Secondary | ICD-10-CM

## 2016-12-26 DIAGNOSIS — M549 Dorsalgia, unspecified: Secondary | ICD-10-CM | POA: Diagnosis not present

## 2016-12-26 DIAGNOSIS — Z8249 Family history of ischemic heart disease and other diseases of the circulatory system: Secondary | ICD-10-CM | POA: Diagnosis not present

## 2016-12-26 DIAGNOSIS — K8020 Calculus of gallbladder without cholecystitis without obstruction: Secondary | ICD-10-CM | POA: Diagnosis present

## 2016-12-26 DIAGNOSIS — Z79899 Other long term (current) drug therapy: Secondary | ICD-10-CM | POA: Insufficient documentation

## 2016-12-26 DIAGNOSIS — F419 Anxiety disorder, unspecified: Secondary | ICD-10-CM | POA: Insufficient documentation

## 2016-12-26 DIAGNOSIS — Z888 Allergy status to other drugs, medicaments and biological substances status: Secondary | ICD-10-CM | POA: Insufficient documentation

## 2016-12-26 HISTORY — PX: CHOLECYSTECTOMY: SHX55

## 2016-12-26 HISTORY — DX: Post-traumatic stress disorder, unspecified: F43.10

## 2016-12-26 HISTORY — DX: Constipation, unspecified: K59.00

## 2016-12-26 LAB — COMPREHENSIVE METABOLIC PANEL
ALBUMIN: 3.9 g/dL (ref 3.5–5.0)
ALT: 50 U/L (ref 14–54)
AST: 49 U/L — AB (ref 15–41)
Alkaline Phosphatase: 64 U/L (ref 38–126)
Anion gap: 10 (ref 5–15)
BILIRUBIN TOTAL: 0.8 mg/dL (ref 0.3–1.2)
BUN: 10 mg/dL (ref 6–20)
CHLORIDE: 108 mmol/L (ref 101–111)
CO2: 20 mmol/L — ABNORMAL LOW (ref 22–32)
Calcium: 9.3 mg/dL (ref 8.9–10.3)
Creatinine, Ser: 0.7 mg/dL (ref 0.44–1.00)
GFR calc Af Amer: >60 mL/min (ref >60)
GFR calc non Af Amer: >60 mL/min (ref >60)
GLUCOSE: 100 mg/dL — AB (ref 65–99)
POTASSIUM: 3.6 mmol/L (ref 3.5–5.1)
Sodium: 138 mmol/L (ref 135–145)
Total Protein: 8.1 g/dL (ref 6.5–8.1)

## 2016-12-26 LAB — CBC
HEMATOCRIT: 40.1 % (ref 36.0–46.0)
Hemoglobin: 13.6 g/dL (ref 12.0–15.0)
MCH: 32.9 pg (ref 26.0–34.0)
MCHC: 33.9 g/dL (ref 30.0–36.0)
MCV: 97.1 fL (ref 78.0–100.0)
Platelets: 148 10*3/uL — ABNORMAL LOW (ref 150–400)
RBC: 4.13 MIL/uL (ref 3.87–5.11)
RDW: 13.4 % (ref 11.5–15.5)
WBC: 6.5 10*3/uL (ref 4.0–10.5)

## 2016-12-26 SURGERY — LAPAROSCOPIC CHOLECYSTECTOMY WITH INTRAOPERATIVE CHOLANGIOGRAM
Anesthesia: General | Site: Abdomen

## 2016-12-26 MED ORDER — DEXAMETHASONE SODIUM PHOSPHATE 10 MG/ML IJ SOLN
INTRAMUSCULAR | Status: AC
  Filled 2016-12-26: qty 1

## 2016-12-26 MED ORDER — PROPOFOL 10 MG/ML IV BOLUS
INTRAVENOUS | Status: DC | PRN
  Administered 2016-12-26: 150 mg via INTRAVENOUS

## 2016-12-26 MED ORDER — OXYCODONE HCL 5 MG PO TABS: 5.0000 mg | ORAL_TABLET | ORAL | 0 refills | Status: AC | PRN

## 2016-12-26 MED ORDER — IOPAMIDOL (ISOVUE-300) INJECTION 61%
INTRAVENOUS | Status: AC
  Filled 2016-12-26: qty 50

## 2016-12-26 MED ORDER — ROCURONIUM BROMIDE 10 MG/ML (PF) SYRINGE
PREFILLED_SYRINGE | INTRAVENOUS | Status: AC
  Filled 2016-12-26: qty 5

## 2016-12-26 MED ORDER — PROPOFOL 10 MG/ML IV BOLUS
INTRAVENOUS | Status: AC
  Filled 2016-12-26: qty 20

## 2016-12-26 MED ORDER — MIDAZOLAM HCL 5 MG/5ML IJ SOLN
INTRAMUSCULAR | Status: DC | PRN
  Administered 2016-12-26: 2 mg via INTRAVENOUS

## 2016-12-26 MED ORDER — LIDOCAINE 2% (20 MG/ML) 5 ML SYRINGE
INTRAMUSCULAR | Status: AC
  Filled 2016-12-26: qty 5

## 2016-12-26 MED ORDER — FENTANYL CITRATE (PF) 100 MCG/2ML IJ SOLN
INTRAMUSCULAR | Status: AC
  Filled 2016-12-26: qty 2

## 2016-12-26 MED ORDER — DEXAMETHASONE SODIUM PHOSPHATE 10 MG/ML IJ SOLN
INTRAMUSCULAR | Status: DC | PRN
  Administered 2016-12-26: 10 mg via INTRAVENOUS

## 2016-12-26 MED ORDER — FENTANYL CITRATE (PF) 100 MCG/2ML IJ SOLN
25.0000 ug | INTRAMUSCULAR | Status: DC | PRN
  Administered 2016-12-26 (×2): 25 ug via INTRAVENOUS
  Administered 2016-12-26: 50 ug via INTRAVENOUS

## 2016-12-26 MED ORDER — CIPROFLOXACIN IN D5W 400 MG/200ML IV SOLN
400.0000 mg | INTRAVENOUS | Status: AC
  Administered 2016-12-26: 400 mg via INTRAVENOUS
  Filled 2016-12-26: qty 200

## 2016-12-26 MED ORDER — SODIUM CHLORIDE 0.9 % IR SOLN
Status: DC | PRN
  Administered 2016-12-26: 1000 mL

## 2016-12-26 MED ORDER — CHLORHEXIDINE GLUCONATE CLOTH 2 % EX PADS: 6.0000 | MEDICATED_PAD | Freq: Once | CUTANEOUS | Status: DC

## 2016-12-26 MED ORDER — ROCURONIUM BROMIDE 10 MG/ML (PF) SYRINGE
PREFILLED_SYRINGE | INTRAVENOUS | Status: DC | PRN
  Administered 2016-12-26: 10 mg via INTRAVENOUS
  Administered 2016-12-26: 50 mg via INTRAVENOUS

## 2016-12-26 MED ORDER — PHENYLEPHRINE HCL 10 MG/ML IJ SOLN: INTRAMUSCULAR | Status: DC | PRN

## 2016-12-26 MED ORDER — BUPIVACAINE-EPINEPHRINE 0.25% -1:200000 IJ SOLN
INTRAMUSCULAR | Status: DC | PRN
  Administered 2016-12-26: 16 mL

## 2016-12-26 MED ORDER — PHENYLEPHRINE 40 MCG/ML (10ML) SYRINGE FOR IV PUSH (FOR BLOOD PRESSURE SUPPORT)
PREFILLED_SYRINGE | INTRAVENOUS | Status: DC | PRN
  Administered 2016-12-26: 80 ug via INTRAVENOUS

## 2016-12-26 MED ORDER — IOPAMIDOL (ISOVUE-300) INJECTION 61%
INTRAVENOUS | Status: DC | PRN
Start: 2016-12-26 — End: 2016-12-26
  Administered 2016-12-26: 3 mL

## 2016-12-26 MED ORDER — PHENYLEPHRINE 40 MCG/ML (10ML) SYRINGE FOR IV PUSH (FOR BLOOD PRESSURE SUPPORT)
PREFILLED_SYRINGE | INTRAVENOUS | Status: AC
  Filled 2016-12-26: qty 10

## 2016-12-26 MED ORDER — FENTANYL CITRATE (PF) 250 MCG/5ML IJ SOLN
INTRAMUSCULAR | Status: AC
  Filled 2016-12-26: qty 5

## 2016-12-26 MED ORDER — 0.9 % SODIUM CHLORIDE (POUR BTL) OPTIME
TOPICAL | Status: DC | PRN
  Administered 2016-12-26: 1000 mL

## 2016-12-26 MED ORDER — PROMETHAZINE HCL 25 MG/ML IJ SOLN: 6.2500 mg | INTRAMUSCULAR | Status: DC | PRN

## 2016-12-26 MED ORDER — MIDAZOLAM HCL 2 MG/2ML IJ SOLN
INTRAMUSCULAR | Status: AC
  Filled 2016-12-26: qty 2

## 2016-12-26 MED ORDER — BUPIVACAINE-EPINEPHRINE (PF) 0.25% -1:200000 IJ SOLN
INTRAMUSCULAR | Status: AC
  Filled 2016-12-26: qty 30

## 2016-12-26 MED ORDER — ONDANSETRON HCL 4 MG/2ML IJ SOLN
INTRAMUSCULAR | Status: DC | PRN
  Administered 2016-12-26: 4 mg via INTRAVENOUS

## 2016-12-26 MED ORDER — ONDANSETRON HCL 4 MG/2ML IJ SOLN
INTRAMUSCULAR | Status: AC
  Filled 2016-12-26: qty 2

## 2016-12-26 MED ORDER — FENTANYL CITRATE (PF) 100 MCG/2ML IJ SOLN
INTRAMUSCULAR | Status: DC | PRN
  Administered 2016-12-26 (×5): 50 ug via INTRAVENOUS

## 2016-12-26 MED ORDER — LACTATED RINGERS IV SOLN
INTRAVENOUS | Status: DC | PRN
  Administered 2016-12-26: 07:00:00 via INTRAVENOUS

## 2016-12-26 MED ORDER — SUGAMMADEX SODIUM 200 MG/2ML IV SOLN
INTRAVENOUS | Status: DC | PRN
  Administered 2016-12-26: 200 mg via INTRAVENOUS

## 2016-12-26 MED ORDER — LIDOCAINE 2% (20 MG/ML) 5 ML SYRINGE
INTRAMUSCULAR | Status: DC | PRN
  Administered 2016-12-26: 100 mg via INTRAVENOUS

## 2016-12-26 MED ORDER — GABAPENTIN 300 MG PO CAPS
300.0000 mg | ORAL_CAPSULE | ORAL | Status: AC
  Administered 2016-12-26: 300 mg via ORAL
  Filled 2016-12-26: qty 1

## 2016-12-26 MED ORDER — ACETAMINOPHEN 500 MG PO TABS
1000.0000 mg | ORAL_TABLET | ORAL | Status: AC
  Administered 2016-12-26: 1000 mg via ORAL
  Filled 2016-12-26: qty 2

## 2016-12-26 MED ORDER — SUGAMMADEX SODIUM 200 MG/2ML IV SOLN
INTRAVENOUS | Status: AC
  Filled 2016-12-26: qty 2

## 2016-12-26 SURGICAL SUPPLY — 40 items
APPLIER CLIP 5 13 M/L LIGAMAX5 (MISCELLANEOUS) ×3
CANISTER SUCT 3000ML PPV (MISCELLANEOUS) ×3 IMPLANT
CHLORAPREP W/TINT 26ML (MISCELLANEOUS) ×3 IMPLANT
CLIP APPLIE 5 13 M/L LIGAMAX5 (MISCELLANEOUS) ×1 IMPLANT
CLOSURE WOUND 1/2 X4 (GAUZE/BANDAGES/DRESSINGS) ×1
COVER MAYO STAND STRL (DRAPES) ×3 IMPLANT
COVER SURGICAL LIGHT HANDLE (MISCELLANEOUS) ×3 IMPLANT
DERMABOND ADVANCED (GAUZE/BANDAGES/DRESSINGS) ×2
DERMABOND ADVANCED .7 DNX12 (GAUZE/BANDAGES/DRESSINGS) ×1 IMPLANT
DRAPE C-ARM 42X72 X-RAY (DRAPES) ×3 IMPLANT
DRSG TEGADERM 2-3/8X2-3/4 SM (GAUZE/BANDAGES/DRESSINGS) ×12 IMPLANT
ELECT REM PT RETURN 9FT ADLT (ELECTROSURGICAL) ×3
ELECTRODE REM PT RTRN 9FT ADLT (ELECTROSURGICAL) ×1 IMPLANT
GLOVE BIOGEL PI IND STRL 8 (GLOVE) ×1 IMPLANT
GLOVE BIOGEL PI INDICATOR 8 (GLOVE) ×2
GLOVE ECLIPSE 7.5 STRL STRAW (GLOVE) ×3 IMPLANT
GOWN STRL REUS W/ TWL LRG LVL3 (GOWN DISPOSABLE) ×2 IMPLANT
GOWN STRL REUS W/TWL LRG LVL3 (GOWN DISPOSABLE) ×4
KIT BASIN OR (CUSTOM PROCEDURE TRAY) ×3 IMPLANT
KIT ROOM TURNOVER OR (KITS) ×3 IMPLANT
L-HOOK LAP DISP 36CM (ELECTROSURGICAL)
LHOOK LAP DISP 36CM (ELECTROSURGICAL) IMPLANT
NS IRRIG 1000ML POUR BTL (IV SOLUTION) ×3 IMPLANT
PAD ARMBOARD 7.5X6 YLW CONV (MISCELLANEOUS) ×3 IMPLANT
PENCIL BUTTON HOLSTER BLD 10FT (ELECTRODE) IMPLANT
POUCH RETRIEVAL ECOSAC 10 (ENDOMECHANICALS) ×1 IMPLANT
POUCH RETRIEVAL ECOSAC 10MM (ENDOMECHANICALS) ×2
SCISSORS LAP 5X35 DISP (ENDOMECHANICALS) ×3 IMPLANT
SET CHOLANGIOGRAPH 5 50 .035 (SET/KITS/TRAYS/PACK) ×3 IMPLANT
SET IRRIG TUBING LAPAROSCOPIC (IRRIGATION / IRRIGATOR) ×3 IMPLANT
SLEEVE ENDOPATH XCEL 5M (ENDOMECHANICALS) ×6 IMPLANT
SPECIMEN JAR SMALL (MISCELLANEOUS) ×3 IMPLANT
STRIP CLOSURE SKIN 1/2X4 (GAUZE/BANDAGES/DRESSINGS) ×2 IMPLANT
SUT MNCRL AB 4-0 PS2 18 (SUTURE) ×3 IMPLANT
TOWEL OR 17X24 6PK STRL BLUE (TOWEL DISPOSABLE) ×3 IMPLANT
TOWEL OR 17X26 10 PK STRL BLUE (TOWEL DISPOSABLE) ×3 IMPLANT
TRAY LAPAROSCOPIC MC (CUSTOM PROCEDURE TRAY) ×3 IMPLANT
TROCAR XCEL BLUNT TIP 100MML (ENDOMECHANICALS) ×3 IMPLANT
TROCAR XCEL NON-BLD 5MMX100MML (ENDOMECHANICALS) ×3 IMPLANT
TUBING INSUFFLATION (TUBING) ×3 IMPLANT

## 2016-12-26 NOTE — Op Note (Addendum)
OPERATIVE REPORT  DATE OF OPERATION: 12/26/2016  PATIENT:  Marilyn Hurley  56 y.o. female  PRE-OPERATIVE DIAGNOSIS:  symptomatic cholelithiasis  POST-OPERATIVE DIAGNOSIS:  symptomatic cholelithiasis  INDICATION(S) FOR OPERATION:  Symptomatic cholelithiasis  FINDINGS:  Scarred in GB with normal IOC.  PROCEDURE:  Procedure(s): LAPAROSCOPIC CHOLECYSTECTOMY WITH INTRAOPERATIVE CHOLANGIOGRAM  SURGEON:  Surgeon(s): Judeth Horn, MD  ASSISTANT: None  ANESTHESIA:   general  COMPLICATIONS:  None  EBL: <10 ml  BLOOD ADMINISTERED: none  DRAINS: none   SPECIMEN:  Source of Specimen:  Gallbladder and contents  COUNTS CORRECT:  YES  PROCEDURE DETAILS: The patient was taken to the operating room and placed on the table in the supine position.  After an adequate endotracheal anesthetic was administered, the patient was prepped with ChloroPrep, and then draped in the usual manner exposing the entire abdomen laterally, inferiorly and up  to the costal margins.  After a proper timeout was performed including identifying the patient and the procedure to be performed, a infraumbilical 4.6EV midline incision was made using a #15 blade.  This was taken down to the fascia which was then incised with a #15 blade.  The edges of the fascia were tented up with Kocher clamps as the preperitoneal space was penetrated with a Kelly clamp into the peritoneum.  Once this was done, a pursestring suture of 0 Vicryl was passed around the fascial opening.  This was subsequently used to secure the [facility] cannula which was passed into the peritoneal cavity.  Once the [facility] cannula was in place, carbon dioxide gas was insufflated into the peritoneal cavity up to a maximal intra-abdominal pressure of 66mm Hg.The laparoscope, with attached camera and light source, was passed into the peritoneal cavity to visualize the direct insertion of two right upper quadrant 41mm cannulas, and a sup-xiphoid 31mm cannula.  Once all  cannulas were in place, the dissection was begun.  Two ratcheted graspers were attached to the dome and infundibulum of the gallbladder and retracted towards the anterior abdominal wall and the right upper quadrant.  Using cautery attached to a dissecting forceps, the peritoneum overlaying the triangle of Chalot and the hepatoduodenal triangle was dissected away exposing the cystic duct and the cystic artery.  A critical window was developed between the CBD and the cystic duct The cystic artery was clipped proximally and distally then transected.  A clip was placed on the gallbladder side of the cystic duct, then a cholecystodochotomy made using the laparoscopic scissors.  Through the cholecystodochotomy a Cook catheter was passed to performed a cholangiogram.  The cholangiogram showed Good flow into the duodenum, good proximal filling, no intraductal filling defects, no dilatation..  Once the cholangiogram was completed, the [facility] catheter was removed, and the distal cystic duct was clipped multiple times then transected between the clips.  The gallbladder was then dissected out of the hepatic bed without event.  It was retrieved from the abdomen using and EcoPouch bag without event.  Once the gallbladder was removed, the bed was inspected for hemostasis.  Once excellent hemostasis was obtained all gas and fluids were aspirated from above the liver, then the cannulas were removed.  The infraumbilical incision was closed using the pursestring suture which was in place.  0.25% bupivicaine with epinephrine was injected at all sites.  All 68mm or greater cannula sites were close using a running subcuticular stitch of 4-0 Monocryl.  5.66mm cannula sites were closed with Dermabond only.Steri-Strips and Tagaderm were used to complete the dressings at all  sites.  At this point all needle, sponge, and instrument counts were correct.The patient was awakened from anesthesia and taken to the PACU in stable  condition.  Kathryne Eriksson. Dahlia Bailiff, MD, [place] [phone] [facility]  PATIENT DISPOSITION:  PACU - hemodynamically stable.        Kade Rickels 10/11/20188:59 AM

## 2016-12-26 NOTE — Discharge Instructions (Addendum)
Laparoscopic Cholecystectomy, Care After °This sheet gives you information about how to care for yourself after your procedure. Your health care provider may also give you more specific instructions. If you have problems or questions, contact your health care provider. °What can I expect after the procedure? °After the procedure, it is common to have: °· Pain at your incision sites. You will be given medicines to control this pain. °· Mild nausea or vomiting. °· Bloating and possible shoulder pain from the air-like gas that was used during the procedure. ° °Follow these instructions at home: °Incision care ° °· Follow instructions from your health care provider about how to take care of your incisions. Make sure you: °? Wash your hands with soap and water before you change your bandage (dressing). If soap and water are not available, use hand sanitizer. °? Change your dressing as told by your health care provider. °? Leave stitches (sutures), skin glue, or adhesive strips in place. These skin closures may need to be in place for 2 weeks or longer. If adhesive strip edges start to loosen and curl up, you may trim the loose edges. Do not remove adhesive strips completely unless your health care provider tells you to do that. °· Do not take baths, swim, or use a hot tub until your health care provider approves. Ask your health care provider if you can take showers. You may only be allowed to take sponge baths for bathing. °· Check your incision area every day for signs of infection. Check for: °? More redness, swelling, or pain. °? More fluid or blood. °? Warmth. °? Pus or a bad smell. °Activity °· Do not drive or use heavy machinery while taking prescription pain medicine. °· Do not lift anything that is heavier than 10 lb (4.5 kg) until your health care provider approves. °· Do not play contact sports until your health care provider approves. °· Do not drive for 24 hours if you were given a medicine to help you relax  (sedative). °· Rest as needed. Do not return to work or school until your health care provider approves. °General instructions °· Take over-the-counter and prescription medicines only as told by your health care provider. °· To prevent or treat constipation while you are taking prescription pain medicine, your health care provider may recommend that you: °? Drink enough fluid to keep your urine clear or pale yellow. °? Take over-the-counter or prescription medicines. °? Eat foods that are high in fiber, such as fresh fruits and vegetables, whole grains, and beans. °? Limit foods that are high in fat and processed sugars, such as fried and sweet foods. °Contact a health care provider if: °· You develop a rash. °· You have more redness, swelling, or pain around your incisions. °· You have more fluid or blood coming from your incisions. °· Your incisions feel warm to the touch. °· You have pus or a bad smell coming from your incisions. °· You have a fever. °· One or more of your incisions breaks open. °Get help right away if: °· You have trouble breathing. °· You have chest pain. °· You have increasing pain in your shoulders. °· You faint or feel dizzy when you stand. °· You have severe pain in your abdomen. °· You have nausea or vomiting that lasts for more than one day. °· You have leg pain. °This information is not intended to replace advice given to you by your health care provider. Make sure you discuss any questions you   have with your health care provider.  Marilyn Hurley. Dahlia Bailiff, MD, [place] [phone] [facility]

## 2016-12-26 NOTE — Transfer of Care (Signed)
Immediate Anesthesia Transfer of Care Note  Patient: Marilyn Hurley  Procedure(s) Performed: LAPAROSCOPIC CHOLECYSTECTOMY WITH INTRAOPERATIVE CHOLANGIOGRAM (N/A Abdomen)  Patient Location: PACU  Anesthesia Type:General  Level of Consciousness: awake, alert  and oriented  Airway & Oxygen Therapy: Patient Spontanous Breathing and Patient connected to nasal cannula oxygen  Post-op Assessment: Report given to RN and Post -op Vital signs reviewed and stable  Post vital signs: Reviewed and stable  Last Vitals:  Vitals:   12/26/16 0628  BP: 114/80  Pulse: 77  Resp: (!) 2  Temp: 36.8 C  SpO2: 97%    Last Pain:  Vitals:   12/26/16 0628  TempSrc: Oral         Complications: No apparent anesthesia complications

## 2016-12-26 NOTE — Progress Notes (Signed)
Report given to philip lopez rn as caregiver 

## 2016-12-26 NOTE — Anesthesia Procedure Notes (Signed)
Procedure Name: Intubation Date/Time: 12/26/2016 7:38 AM Performed by: Imagene Riches Pre-anesthesia Checklist: Patient identified, Emergency Drugs available, Patient being monitored and Suction available Patient Re-evaluated:Patient Re-evaluated prior to induction Oxygen Delivery Method: Circle system utilized Preoxygenation: Pre-oxygenation with 100% oxygen Induction Type: IV induction Ventilation: Mask ventilation without difficulty and Oral airway inserted - appropriate to patient size Laryngoscope Size: Miller and 2 Grade View: Grade I Tube type: Oral Tube size: 7.0 mm Number of attempts: 1 Airway Equipment and Method: Stylet Placement Confirmation: ETT inserted through vocal cords under direct vision,  positive ETCO2 and breath sounds checked- equal and bilateral Secured at: 22 cm Tube secured with: Tape Dental Injury: Teeth and Oropharynx as per pre-operative assessment

## 2016-12-26 NOTE — H&P (Signed)
Marilyn Hurley 11/19/2016 8:53 AM Location: [facility] Patient #: [id number] DOB: October 24, 1960 Single / Language: Marilyn Hurley / Race: White Female   History of Present Illness Marilyn Hurley. Marilyn Hurley; 11/19/2016 9:33 AM) The patient is a 56 year old female who presents for evaluation of gall stones. The onset of the gall stones has been gradual (Having symptoms for years. Only recently diagnosed with gallstones.) and has been occurring in a persistent pattern for 1 year. The course has been gradually worsening. The gall stones is described as moderate to severe. There has been associated abdominal pain, difficulty with digestion, flatulence, gastrointestinal upset and nausea. Precipitating factors include eating (anything from water to hamburgers).  Patient has been having symptoms since last office visit.  No fevers or chills.  No jaundice.   Past Surgical History Marilyn Hur[place], [place]; 11/19/2016 8:53 AM) Cesarean Section - 1   Diagnostic Studies History Marilyn Hurley, [place]; 11/19/2016 8:53 AM) Colonoscopy  within last year Mammogram  within last year Pap Smear  1-5 years ago  Allergies Marilyn Hurley, RMA; 11/19/2016 8:55 AM) Ibuprofen *ANALGESICS - ANTI-INFLAMMATORY*  Keflex *CEPHALOSPORINS*  LamoTRIgine *ANTICONVULSANTS*   Medication History Marilyn Hurley, RMA; 11/19/2016 9:00 AM) Acetaminophen-Codeine #3 (300-30MG  Tablet, Oral) Active. ClonazePAM (1MG  Tablet, Oral) Active. Chantix Starting Month Pak (0.5 MG X 11 &1 MG X 42 Tablet, Oral) Active. (starts 10/25/16) Levothyroxine Sodium (150MCG Tablet, Oral) Active. Linzess (145MCG Capsule, Oral) Active. QUEtiapine Fumarate (50MG  Tablet, Oral) Active. (50mg  in the am 50mg  in pm and 100 at night) Medications Reconciled  Social History Marilyn Hurley, [place]; 11/19/2016 8:53 AM) Alcohol use  Occasional alcohol use. Caffeine use  Tea. Illicit drug use  Remotely quit drug use. Tobacco use  Current every day  smoker.  Family History Marilyn Hurley, [place]; 11/19/2016 8:53 AM) Diabetes Mellitus  Mother. Heart Disease  Brother, Mother. Heart disease in female family member before age 47  Heart disease in female family member before age 40   Pregnancy / Birth History Marilyn Hurley, [place]; 11/19/2016 8:53 AM) Age at menarche  31 years. Contraceptive History  Oral contraceptives. Irregular periods  Maternal age  99-35  Other Problems Marilyn Hurley, [place]; 11/19/2016 8:53 AM) Anxiety Disorder  Back Pain  Depression  Gastroesophageal Reflux Disease     Review of Systems Marilyn Hurley RMA; 11/19/2016 8:53 AM) General Present- Fatigue and Weight Gain. Not Present- Appetite Loss, Chills, Fever, Night Sweats and Weight Loss. Skin Not Present- Change in Wart/Mole, Dryness, Hives, Jaundice, New Lesions, Non-Healing Wounds, Rash and Ulcer. HEENT Present- Wears glasses/contact lenses. Not Present- Earache, Hearing Loss, Hoarseness, Nose Bleed, Oral Ulcers, Ringing in the Ears, Seasonal Allergies, Sinus Pain, Sore Throat, Visual Disturbances and Yellow Eyes. Respiratory Not Present- Bloody sputum, Chronic Cough, Difficulty Breathing, Snoring and Wheezing. Breast Not Present- Breast Mass, Breast Pain, Nipple Discharge and Skin Changes. Cardiovascular Present- Leg Cramps and Palpitations. Not Present- Chest Pain, Difficulty Breathing Lying Down, Rapid Heart Rate, Shortness of Breath and Swelling of Extremities. Gastrointestinal Present- Abdominal Pain, Constipation and Excessive gas. Not Present- Bloating, Bloody Stool, Change in Bowel Habits, Chronic diarrhea, Difficulty Swallowing, Gets full quickly at meals, Hemorrhoids, Indigestion, Nausea, Rectal Pain and Vomiting. Female Genitourinary Not Present- Frequency, Nocturia, Painful Urination, Pelvic Pain and Urgency. Musculoskeletal Present- Back Pain and Muscle Weakness. Not Present- Joint Pain, Joint Stiffness, Muscle Pain and Swelling of  Extremities. Neurological Present- Decreased Memory, Numbness and Tingling. Not Present- Fainting, Headaches, Seizures, Tremor, Trouble walking and Weakness. Psychiatric Present- Anxiety, Bipolar, Change in Sleep Pattern, Depression,  Fearful and Frequent crying. Endocrine Present- Hot flashes. Not Present- Cold Intolerance, Excessive Hunger, Hair Changes, Heat Intolerance and New Diabetes. Hematology Not Present- Blood Thinners, Easy Bruising, Excessive bleeding, Gland problems, HIV and Persistent Infections.  Vitals Marilyn Hurley RMA; 11/19/2016 9:00 AM) 11/19/2016 9:00 AM Weight: 195 lb Height: 65in Body Surface Area: 1.96 m Body Mass Index: 32.45 kg/m  Temp.: 97.19F  Pulse: 77 (Regular)  BP: 106/80 (Sitting, Left Arm, Standard) BP current 114/80, P 77   Physical Exam (Marilyn Cueto O. Hulen Skains Hurley; 11/19/2016 9:37 AM) General Mental Status-Alert. General Appearance-Cooperative and Well groomed. Orientation-Oriented X4. Build & Nutrition-Obese(Not morbidly). Posture-Normal posture. Voice-Normal.  Chest and Lung Exam Chest and lung exam reveals -normal excursion with symmetric chest walls, quiet, even and easy respiratory effort with no use of accessory muscles and on auscultation, normal breath sounds, no adventitious sounds and normal vocal resonance.  Cardiovascular Cardiovascular examination reveals -normal heart sounds, regular rate and rhythm with no murmurs.  Abdomen Palpation/Percussion Tenderness - Epigastrium(No palpable masses.) and Right Upper Quadrant.  Still some tenderness in the upper abdomen but pain in in the LLQ    Assessment & Plan Marilyn Hurley. Marilyn Ritchey Hurley; 11/19/2016 9:40 AM) SYMPTOMATIC CHOLELITHIASIS (K80.20) Impression: Has RUQ pain and tenderness. Epigastric tenderness. I told the patient that not all her symptoms will improve with cholecystectomy, but her symptoms should significantly improve Current Plans:  Laparoscopic cholecystectomy with  IOC based on history or RUQ pain and abnormal LFTs  Today's labs are pending.  Marilyn Hurley. Marilyn Bailiff, [place], [place] [phone] [facility]

## 2016-12-26 NOTE — Anesthesia Postprocedure Evaluation (Signed)
Anesthesia Post Note  Patient: Marilyn Hurley  Procedure(s) Performed: LAPAROSCOPIC CHOLECYSTECTOMY WITH INTRAOPERATIVE CHOLANGIOGRAM (N/A Abdomen)     Patient location during evaluation: PACU Anesthesia Type: General Level of consciousness: awake and alert Pain management: pain level controlled Vital Signs Assessment: post-procedure vital signs reviewed and stable Respiratory status: spontaneous breathing, nonlabored ventilation, respiratory function stable and patient connected to nasal cannula oxygen Cardiovascular status: blood pressure returned to baseline and stable Postop Assessment: no apparent nausea or vomiting Anesthetic complications: no    Last Vitals:  Vitals:   12/26/16 0915 12/26/16 0930  BP: 121/84 129/90  Pulse: 74 68  Resp:    Temp:    SpO2: 94% 93%    Last Pain:  Vitals:   12/26/16 0915  TempSrc:   PainSc: Reynolds E Brock

## 2016-12-27 ENCOUNTER — Encounter (HOSPITAL_COMMUNITY): Payer: Self-pay | Admitting: General Surgery

## 2016-12-28 ENCOUNTER — Encounter (HOSPITAL_COMMUNITY): Payer: Self-pay | Admitting: *Deleted

## 2016-12-28 ENCOUNTER — Emergency Department (HOSPITAL_COMMUNITY)
Admission: EM | Admit: 2016-12-28 | Discharge: 2016-12-28 | Disposition: A | Discharge status: Home / Self Care | Payer: Medicaid Other | Attending: Emergency Medicine | Admitting: Emergency Medicine

## 2016-12-28 ENCOUNTER — Emergency Department (HOSPITAL_COMMUNITY): Payer: Medicaid Other

## 2016-12-28 DIAGNOSIS — E039 Hypothyroidism, unspecified: Secondary | ICD-10-CM | POA: Insufficient documentation

## 2016-12-28 DIAGNOSIS — R1084 Generalized abdominal pain: Secondary | ICD-10-CM | POA: Insufficient documentation

## 2016-12-28 DIAGNOSIS — Z79899 Other long term (current) drug therapy: Secondary | ICD-10-CM | POA: Insufficient documentation

## 2016-12-28 DIAGNOSIS — Z85828 Personal history of other malignant neoplasm of skin: Secondary | ICD-10-CM | POA: Diagnosis not present

## 2016-12-28 DIAGNOSIS — D0590 Unspecified type of carcinoma in situ of unspecified breast: Secondary | ICD-10-CM | POA: Insufficient documentation

## 2016-12-28 DIAGNOSIS — Z7902 Long term (current) use of antithrombotics/antiplatelets: Secondary | ICD-10-CM | POA: Diagnosis not present

## 2016-12-28 DIAGNOSIS — D010 Carcinoma in situ of colon: Secondary | ICD-10-CM | POA: Insufficient documentation

## 2016-12-28 LAB — COMPREHENSIVE METABOLIC PANEL
ALK PHOS: 62 U/L (ref 38–126)
ALT: 42 U/L (ref 14–54)
AST: 45 U/L — ABNORMAL HIGH (ref 15–41)
Albumin: 3.8 g/dL (ref 3.5–5.0)
Anion gap: 6 (ref 5–15)
BUN: 11 mg/dL (ref 6–20)
CALCIUM: 9.4 mg/dL (ref 8.9–10.3)
CO2: 24 mmol/L (ref 22–32)
Chloride: 108 mmol/L (ref 101–111)
Creatinine, Ser: 0.71 mg/dL (ref 0.44–1.00)
GFR calc Af Amer: >60 mL/min (ref >60)
GLUCOSE: 117 mg/dL — AB (ref 65–99)
POTASSIUM: 3.6 mmol/L (ref 3.5–5.1)
Sodium: 138 mmol/L (ref 135–145)
TOTAL PROTEIN: 7.7 g/dL (ref 6.5–8.1)
Total Bilirubin: 0.6 mg/dL (ref 0.3–1.2)

## 2016-12-28 LAB — URINALYSIS, ROUTINE W REFLEX MICROSCOPIC
BILIRUBIN URINE: NEGATIVE
Glucose, UA: NEGATIVE mg/dL
HGB URINE DIPSTICK: NEGATIVE
Ketones, ur: NEGATIVE mg/dL
Leukocytes, UA: NEGATIVE
NITRITE: NEGATIVE
PROTEIN: NEGATIVE mg/dL
Specific Gravity, Urine: 1.01 (ref 1.005–1.030)
pH: 6 (ref 5.0–8.0)

## 2016-12-28 LAB — LIPASE, BLOOD: LIPASE: 39 U/L (ref 11–51)

## 2016-12-28 LAB — CBC
HCT: 38.3 % (ref 36.0–46.0)
HEMOGLOBIN: 12.6 g/dL (ref 12.0–15.0)
MCH: 32.3 pg (ref 26.0–34.0)
MCHC: 32.9 g/dL (ref 30.0–36.0)
MCV: 98.2 fL (ref 78.0–100.0)
PLATELETS: 154 10*3/uL (ref 150–400)
RBC: 3.9 MIL/uL (ref 3.87–5.11)
RDW: 13.8 % (ref 11.5–15.5)
WBC: 11.4 10*3/uL — ABNORMAL HIGH (ref 4.0–10.5)

## 2016-12-28 MED ORDER — MORPHINE SULFATE (PF) 4 MG/ML IV SOLN
4.0000 mg | Freq: Once | INTRAVENOUS | Status: AC
  Administered 2016-12-28: 4 mg via INTRAVENOUS
  Filled 2016-12-28: qty 1

## 2016-12-28 MED ORDER — HYDROMORPHONE HCL 1 MG/ML IJ SOLN
0.5000 mg | Freq: Once | INTRAMUSCULAR | Status: AC
  Administered 2016-12-28: 0.5 mg via INTRAVENOUS
  Filled 2016-12-28: qty 1

## 2016-12-28 MED ORDER — ONDANSETRON HCL 4 MG/2ML IJ SOLN
4.0000 mg | Freq: Once | INTRAMUSCULAR | Status: AC
  Administered 2016-12-28: 4 mg via INTRAVENOUS
  Filled 2016-12-28: qty 2

## 2016-12-28 MED ORDER — IOPAMIDOL (ISOVUE-300) INJECTION 61%
INTRAVENOUS | Status: AC
  Administered 2016-12-28: 100 mL
  Filled 2016-12-28: qty 100

## 2016-12-28 NOTE — ED Notes (Signed)
ED Provider at bedside. 

## 2016-12-28 NOTE — ED Notes (Signed)
Walked patient to the bathroom patient did well 

## 2016-12-28 NOTE — Discharge Instructions (Signed)
It was my pleasure taking care of you today!   Fortunately, your CT scan was reassuring. Tylenol as needed for pain. Use your prescription pain medication only as needed for severe pain.   Keep your scheduled appointment with the surgery clinic. If symptoms persist, call on Monday to arrange a sooner appointment.   Return to ER for fever, new or worsening symptoms, any additional concerns.

## 2016-12-28 NOTE — ED Notes (Signed)
Steri Strip applied to small laprascopic wound to abdomen, minimal bleeidng

## 2016-12-28 NOTE — ED Triage Notes (Signed)
Pt in from home via [facility], per report pt had appendectomy x 3 days ago, pt presents today with unrelieved pain with Norco, pt has distended stomach, bruising to mid abd incision, afebrile, denies n/v/d, A&O x4

## 2016-12-28 NOTE — ED Notes (Signed)
Pt went to CT

## 2016-12-28 NOTE — ED Notes (Signed)
PA at bedside.

## 2016-12-28 NOTE — ED Provider Notes (Signed)
[facility] DEPT Provider Note   CSN: [id number] Arrival date & time: 12/28/16  4707     History   Chief Complaint Chief Complaint  Patient presents with  . Abdominal Pain    HPI Marilyn Hurley is a 56 y.o. female.  The history is provided by the patient and medical records. No language interpreter was used.   Marilyn Hurley is a 56 y.o. female  with GERD, thyroid disease, bipolar disorder who presents to the Emergency Department complaining of severe, generalized abdominal pain, most significantly to the upper abdomen which began around 2 am this morning. Patient underwent laparoscopic cholecystectomy on 10/11 (2 days ago) by Dr. Hulen Skains. She states that yesterday, she was feeling better and able to get up around the house and do small chores such as the laundry and make the bed. She thought she was healing well. This morning, she awoke with sharp, constant abdominal pain associated with nausea, no vomiting. She was given a prescription for 5 mg oxycodone. She took that at 2 AM this morning when pain began. She then took another tablet at 4 AM because pain was not improved. She is taking no other medications today for her symptoms. No fever or chills.   Past Medical History:  Diagnosis Date  . Bipolar disorder ([place])   . Constipation   . DDD (degenerative disc disease), lumbar    BACK+ NECK  . Depression   . Fatty liver   . GERD (gastroesophageal reflux disease)   . Hypothyroidism   . Neuropathy   . Panic attack as reaction to stress    panic attack- gets short of breath  . PTSD (post-traumatic stress disorder)   . Schizophrenia ([place])   . Thyroid disease     Patient Active Problem List   Diagnosis Date Noted  . LGSIL of cervix of undetermined significance 12/06/2016  . 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 Panic attack 09/24/2016  . Hypothyroidism 09/24/2016  . Transaminitis 09/24/2016  . Colon cancer screening 09/24/2016  . Breast cancer screening 09/24/2016  .  Schizophrenia ([place]) 07/11/2016  . Bipolar disorder ([place]) 07/11/2016  . Anxiety state 07/11/2016    Past Surgical History:  Procedure Laterality Date  . AUGMENTATION MAMMAPLASTY Bilateral 1989  . breast implant    . CESAREAN SECTION    . CHOLECYSTECTOMY N/A 12/26/2016   Procedure: LAPAROSCOPIC CHOLECYSTECTOMY WITH INTRAOPERATIVE CHOLANGIOGRAM;  Surgeon: Judeth Horn, MD;  Location: [place];  Service: General;  Laterality: N/A;  . COLONOSCOPY     polyps removed  . ESOPHAGOGASTRODUODENOSCOPY    . MULTIPLE TOOTH EXTRACTIONS    . SKIN CANCER EXCISION     Vullvar inttraepithelial    OB History    Gravida Para Term Preterm AB Living   1 1 1     1    SAB TAB Ectopic Multiple Live Births           1       Home Medications    Prior to Admission medications   Medication Sig Start Date End Date Taking? Authorizing Provider  clonazePAM (KLONOPIN) 1 MG tablet Take 1 tablet (1 mg total) by mouth 2 (two) times daily as needed for anxiety. 10/29/16  Yes Clent Demark, PA-C  levothyroxine (SYNTHROID, LEVOTHROID) 150 MCG tablet Take 1 tablet (150 mcg total) by mouth daily. Patient taking differently: Take 150 mcg by mouth daily before breakfast.  06/10/16  Yes Clent Demark, PA-C  linaclotide [facility]) 290 MCG CAPS capsule Take 290 mcg by  mouth daily before breakfast.   Yes [provider]  nitroGLYCERIN (NITROSTAT) 0.4 MG SL tablet DISSOLVE 1 TABLET UNDER THE TONGUE EVERY 5 MINUTES AS NEEDED FOR CHEST PAIN 12/11/16  Yes Barrett, Evelene Croon, PA-C  omeprazole (PRILOSEC) 40 MG capsule Take 40 mg by mouth daily before supper.    Yes [provider]  oxyCODONE (OXY IR/ROXICODONE) 5 MG immediate release tablet Take 1 tablet (5 mg total) by mouth every 4 (four) hours as needed for severe pain. 12/26/16  Yes Judeth Horn, MD  pravastatin (PRAVACHOL) 40 MG tablet Take 1 tablet (40 mg total) by mouth daily. 12/11/16  Yes Clent Demark, PA-C  QUEtiapine (SEROQUEL) 50 MG tablet  Take 50-200 mg by mouth 3 (three) times daily. Take 1 tablet (50 mg) in the morning, take 1 tablet (50 mg) by mouth at 3 pm, and 4 tablets (200 mg) at night (2100-2200). 10/08/16  Yes [provider]  varenicline (CHANTIX STARTING MONTH PAK) 0.5 MG X 11 & 1 MG X 42 tablet Take one 0.5 mg tablet by mouth once daily for 3 days, then increase to one 0.5 mg tablet twice daily for 4 days, then increase to one 1 mg tablet twice daily. Patient taking differently: Take 1 mg by mouth 2 (two) times daily. Take one 0.5 mg tablet by mouth once daily for 3 days, then increase to one 0.5 mg tablet twice daily for 4 days, then increase to one 1 mg tablet twice daily. 10/29/16  Yes Clent Demark, PA-C    Family History Family History  Problem Relation Age of Onset  . Diabetes Mother   . Heart disease Mother 33       CABG and redo, died in her 44s  . Diabetes Sister   . Heart disease Brother   . Heart attack Brother        65s  . Heart attack Father        Died when she was a baby  . Heart attack Paternal Grandmother     Social History Social History  Substance Use Topics  . Smoking status: Former Smoker    Years: 48.00    Types: Cigarettes    Quit date: 11/25/2016  . Smokeless tobacco: Never Used  . Alcohol use No     Allergies   Ibuprofen; Keflex [cephalexin]; and Lamotrigine   Review of Systems Review of Systems  Gastrointestinal: Positive for abdominal pain and nausea. Negative for blood in stool, constipation, diarrhea and vomiting.  All other systems reviewed and are negative.    Physical Exam Updated Vital Signs BP 101/68 (BP Location: Right Arm)   Pulse 62   Temp 98.6 F (37 C) (Rectal)   Resp 20   Ht 5' 4"  (1.626 m)   Wt 89.8 kg (198 lb)   SpO2 96%   BMI 33.99 kg/m   Physical Exam  Constitutional: She is oriented to person, place, and time. She appears well-developed and well-nourished. No distress.  HENT:  Head: Normocephalic and atraumatic.    Cardiovascular: Normal rate, regular rhythm and normal heart sounds.   No murmur heard. Pulmonary/Chest: Effort normal and breath sounds normal. No respiratory distress. She has no wheezes. She has no rales.  Abdominal: Soft. Bowel sounds are normal. She exhibits no distension.  Generalized, most significantly in upper abdomen. No rebound tenderness.  Musculoskeletal: She exhibits no edema.  Neurological: She is alert and oriented to person, place, and time.  Skin: Skin is warm and dry.  Nursing note and vitals reviewed.    ED Treatments / Results  Labs (all labs ordered are listed, but only abnormal results are displayed) Labs Reviewed  COMPREHENSIVE METABOLIC PANEL - Abnormal; Notable for the following:       Result Value   Glucose, Bld 117 (*)    AST 45 (*)    All other components within normal limits  CBC - Abnormal; Notable for the following:    WBC 11.4 (*)    All other components within normal limits  LIPASE, BLOOD  URINALYSIS, ROUTINE W REFLEX MICROSCOPIC    EKG  EKG Interpretation None       Radiology Ct Abdomen Pelvis W Contrast  Result Date: 12/28/2016 CLINICAL DATA:  Abdominal pain, nausea, prior cholecystectomy 3 days ago EXAM: CT ABDOMEN AND PELVIS WITH CONTRAST TECHNIQUE: Multidetector CT imaging of the abdomen and pelvis was performed using the standard protocol following bolus administration of intravenous contrast. CONTRAST:  189m ISOVUE-300 IOPAMIDOL (ISOVUE-300) INJECTION 61% COMPARISON:  CT abdomen/ pelvis dated 10/30/2016 FINDINGS: Lower chest: Scarring/ atelectasis in the anterior left lung base (series 4/ image 6). Hepatobiliary: 9 mm cyst in the right hepatic lobe (series 3/ image 18). Status post cholecystectomy. Trace fluid in the gallbladder fossa/surgical bed, likely reflecting a postoperative seroma, within normal limits for recent postoperative appearance. No intrahepatic or extrahepatic ductal dilatation. Pancreas: Within normal limits.  Spleen: Within normal limits. Adrenals/Urinary Tract: Adrenal glands are within normal limits. Kidneys are within normal limits.  No hydronephrosis. Bladder is within normal limits. Stomach/Bowel: Stomach is within normal limits. No evidence bowel obstruction. Appendix is not discretely visualized and may be surgically absent. Mild sigmoid diverticulosis, without evidence of diverticulitis. Vascular/Lymphatic: No evidence of abdominal aortic aneurysm. Atherosclerotic calcifications of the abdominal aorta and branch vessels. 11 mm short axis aortocaval node (series 3/ image 35), likely reactive. Reproductive: Uterus is unremarkable. Bilateral ovaries are within normal limits. Other: No abdominopelvic ascites. Postsurgical changes along the anterior abdominal wall. Musculoskeletal: Mild degenerative changes of the visualized thoracolumbar spine. IMPRESSION: Status post cholecystectomy with associated postsurgical changes, as described above. No CT findings to account for the patient's abdominal pain. Electronically Signed   By: SJulian HyM.D.   On: 12/28/2016 13:17    Procedures Procedures (including critical care time)  Medications Ordered in ED Medications  ondansetron (ZOFRAN) injection 4 mg (4 mg Intravenous Given 12/28/16 1119)  morphine 4 MG/ML injection 4 mg (4 mg Intravenous Given 12/28/16 1119)  HYDROmorphone (DILAUDID) injection 0.5 mg (0.5 mg Intravenous Given 12/28/16 1422)  iopamidol (ISOVUE-300) 61 % injection (100 mLs  Contrast Given 12/28/16 1251)     Initial Impression / Assessment and Plan / ED Course  I have reviewed the triage vital signs and the nursing notes.  Pertinent labs & imaging results that were available during my care of the patient were reviewed by me and considered in my medical decision making (see chart for details).    Marilyn EGLIis a 55y.o. female who presents to ED for abdominal pain that began this morning at 2 am. Patient is s/p laparoscopic  cholecystectomy on 10/11 (2 days ago) by Dr. WHulen Skains Patient states that she was feeling much better yesterday and nearly pain free, but today, severe generalized abdominal pain began. Pain most severe in the epigastrium. On exam, patient is afebrile, hemodynamically stable with generalized abdominal tenderness. Labs reviewed: leukocytosis of 11.4 (6.5 on 10/11). LFT's reassuring. AST 45, ALT/alk phos/bili wdl. CT shows postoperative changes without complication. No  findings to account for pain. Given recent surgery, consulted surgery, Dr. Redmond Pulling, who recommends tylenol as needed for pain and to keep scheduled appointment for follow up.   Patient discussed with Dr. Maryan Rued who agrees with treatment plan.    Final Clinical Impressions(s) / ED Diagnoses   Final diagnoses:  Generalized abdominal pain    New Prescriptions New Prescriptions   No medications on file     , Ozella Almond, PA-C 12/28/16 [place], [place], MD 12/29/16 1916

## 2016-12-30 ENCOUNTER — Ambulatory Visit (INDEPENDENT_AMBULATORY_CARE_PROVIDER_SITE_OTHER): Payer: Medicaid Other | Admitting: Physician Assistant

## 2016-12-30 ENCOUNTER — Encounter (INDEPENDENT_AMBULATORY_CARE_PROVIDER_SITE_OTHER): Payer: Self-pay | Admitting: Physician Assistant

## 2016-12-30 VITALS — BP 117/76 | HR 84 | Temp 97.5°F | Wt 206.8 lb

## 2016-12-30 DIAGNOSIS — R110 Nausea: Secondary | ICD-10-CM

## 2016-12-30 DIAGNOSIS — F410 Panic disorder [episodic paroxysmal anxiety] without agoraphobia: Secondary | ICD-10-CM

## 2016-12-30 DIAGNOSIS — Z720 Tobacco use: Secondary | ICD-10-CM

## 2016-12-30 DIAGNOSIS — R1084 Generalized abdominal pain: Secondary | ICD-10-CM | POA: Diagnosis not present

## 2016-12-30 MED ORDER — CIPROFLOXACIN HCL 500 MG PO TABS: 500.0000 mg | ORAL_TABLET | Freq: Two times a day (BID) | ORAL | 0 refills | Status: AC

## 2016-12-30 MED ORDER — VARENICLINE TARTRATE 1 MG PO TABS: 1.0000 mg | ORAL_TABLET | Freq: Two times a day (BID) | ORAL | 3 refills | Status: AC

## 2016-12-30 MED ORDER — DIAZEPAM 5 MG PO TABS: 5.0000 mg | ORAL_TABLET | Freq: Two times a day (BID) | ORAL | 0 refills | Status: AC | PRN

## 2016-12-30 MED ORDER — ONDANSETRON 8 MG PO TBDP: 8.0000 mg | ORAL_TABLET | Freq: Three times a day (TID) | ORAL | 0 refills | Status: AC | PRN

## 2016-12-30 MED ORDER — METRONIDAZOLE 500 MG PO TABS: 500.0000 mg | ORAL_TABLET | Freq: Two times a day (BID) | ORAL | 0 refills | Status: AC

## 2016-12-30 NOTE — Progress Notes (Signed)
Subjective:  Patient ID: Marilyn Hurley, female    DOB: 1960-06-09  Age: 56 y.o. MRN: [id number]  CC: abdominal pain, forms  HPI  Marilyn Hurley a 56 y.o.femalewith a PMH of Depression, bipolar disorder, anxiety, panic attacks, PTSD, and schizoaffective disorder presents with complaint of hypogastric pain s/p cholecystectomy four days ago. Went to the ED two days after surgery with abdominal pain that is "worse than having children". Pain was mostly epigastric at the time. CT abdomen and pelvis revealed no cause for the pain. Labs revealed 11.4 K WBC. General surgeon consulted at ED and  Feels her abdomen is distended and has associated nausea without vomiting. Has constipation but attributed to oxycodone use. Does not endorse fever, chills, hematochezia, diarrhea, wound dehiscence, wound suppuration, or cellulitis.     Would like a refill of Chantix. She feels the starter pack has helped greatly in reducing her smoking. Says her anxiety is now severe after her surgery, current pain, and less smoking. Says she is now having panic attacks.     Outpatient Medications Prior to Visit  Medication Sig Dispense Refill  . clonazePAM (KLONOPIN) 1 MG tablet Take 1 tablet (1 mg total) by mouth 2 (two) times daily as needed for anxiety. 60 tablet 0  . levothyroxine (SYNTHROID, LEVOTHROID) 150 MCG tablet Take 1 tablet (150 mcg total) by mouth daily. (Patient taking differently: Take 150 mcg by mouth daily before breakfast. ) 90 tablet 3  . linaclotide (LINZESS) 290 MCG CAPS capsule Take 290 mcg by mouth daily before breakfast.    . nitroGLYCERIN (NITROSTAT) 0.4 MG SL tablet DISSOLVE 1 TABLET UNDER THE TONGUE EVERY 5 MINUTES AS NEEDED FOR CHEST PAIN 75 tablet 3  . omeprazole (PRILOSEC) 40 MG capsule Take 40 mg by mouth daily before supper.     Marland Kitchen oxyCODONE (OXY IR/ROXICODONE) 5 MG immediate release tablet Take 1 tablet (5 mg total) by mouth every 4 (four) hours as needed for severe pain. 28 tablet 0  .  pravastatin (PRAVACHOL) 40 MG tablet Take 1 tablet (40 mg total) by mouth daily. 90 tablet 3  . QUEtiapine (SEROQUEL) 50 MG tablet Take 50-200 mg by mouth 3 (three) times daily. Take 1 tablet (50 mg) in the morning, take 1 tablet (50 mg) by mouth at 3 pm, and 4 tablets (200 mg) at night (2100-2200).  0  . varenicline (CHANTIX STARTING MONTH PAK) 0.5 MG X 11 & 1 MG X 42 tablet Take one 0.5 mg tablet by mouth once daily for 3 days, then increase to one 0.5 mg tablet twice daily for 4 days, then increase to one 1 mg tablet twice daily. (Patient taking differently: Take 1 mg by mouth 2 (two) times daily. Take one 0.5 mg tablet by mouth once daily for 3 days, then increase to one 0.5 mg tablet twice daily for 4 days, then increase to one 1 mg tablet twice daily.) 53 tablet 0   No facility-administered medications prior to visit.      ROS Review of Systems  Constitutional: Negative for chills, fever and malaise/fatigue.  Eyes: Negative for blurred vision.  Respiratory: Negative for shortness of breath.   Cardiovascular: Negative for chest pain, palpitations and leg swelling.  Gastrointestinal: Positive for abdominal pain, constipation and nausea. Negative for blood in stool, diarrhea, heartburn, melena and vomiting.  Genitourinary: Negative for dysuria and hematuria.  Musculoskeletal: Negative for joint pain and myalgias.  Skin: Negative for rash.  Neurological: Negative for tingling and headaches.  Psychiatric/Behavioral: Negative  for depression. The patient is nervous/anxious.     Objective:  BP 117/76 (BP Location: Left Arm, Patient Position: Sitting, Cuff Size: Large)   Pulse 84   Temp (!) 97.5 F (36.4 C) (Oral)   Wt 206 lb 12.8 oz (93.8 kg)   SpO2 94%   BMI 35.50 kg/m   BP/Weight 12/30/2016 12/28/2016 27/05/5007  Systolic BP 381 829 937  Diastolic BP 76 72 79  Wt. (Lbs) 206.8 198 203  BMI 35.5 33.99 34.84      Physical Exam  Constitutional: She is oriented to person, place,  and time.  Well developed, overweight, NAD, polite  HENT:  Head: Normocephalic and atraumatic.  Eyes: Conjunctivae are normal. No scleral icterus.  Cardiovascular: Normal rate, regular rhythm and normal heart sounds.   Pulmonary/Chest: Effort normal and breath sounds normal. No respiratory distress. She has no wheezes.  Abdominal: Soft. Bowel sounds are normal. She exhibits distension (mild distention, hypoactive bowel sounds. TTP around the incision sites.). There is no tenderness.  Musculoskeletal: She exhibits no edema.  Neurological: She is alert and oriented to person, place, and time. No cranial nerve deficit. Coordination normal.  Skin: Skin is warm and dry. No rash noted. No erythema. No pallor.  Psychiatric: She has a normal mood and affect. Her behavior is normal. Thought content normal.  Vitals reviewed.    Assessment & Plan:    1. Generalized abdominal pain - I have advised patient to return to the ED should she continue with abdominal pain or develop worsening symptoms.  - Begin ciprofloxacin (CIPRO) 500 MG tablet; Take 1 tablet (500 mg total) by mouth 2 (two) times daily.  Dispense: 10 tablet; Refill: 0 - Begin metroNIDAZOLE (FLAGYL) 500 MG tablet; Take 1 tablet (500 mg total) by mouth 2 (two) times daily.  Dispense: 14 tablet; Refill: 0  2. Panic attacks - Begin diazepam (VALIUM) 5 MG tablet; Take 1 tablet (5 mg total) by mouth every 12 (twelve) hours as needed for anxiety.  Dispense: 30 tablet; Refill: 0  3. Nausea without vomiting - Begin ondansetron (ZOFRAN-ODT) 8 MG disintegrating tablet; Take 1 tablet (8 mg total) by mouth every 8 (eight) hours as needed for nausea or vomiting.  Dispense: 20 tablet; Refill: 0  4. Tobacco abuse - Begin varenicline (CHANTIX CONTINUING MONTH PAK) 1 MG tablet; Take 1 tablet (1 mg total) by mouth 2 (two) times daily.  Dispense: 60 tablet; Refill: 3   Meds ordered this encounter  Medications  . ondansetron (ZOFRAN-ODT) 8 MG  disintegrating tablet    Sig: Take 1 tablet (8 mg total) by mouth every 8 (eight) hours as needed for nausea or vomiting.    Dispense:  20 tablet    Refill:  0    Order Specific Question:   Supervising Provider    Answer:   Tresa Garter [id number]  . diazepam (VALIUM) 5 MG tablet    Sig: Take 1 tablet (5 mg total) by mouth every 12 (twelve) hours as needed for anxiety.    Dispense:  30 tablet    Refill:  0    Order Specific Question:   Supervising Provider    Answer:   Tresa Garter [id number]  . ciprofloxacin (CIPRO) 500 MG tablet    Sig: Take 1 tablet (500 mg total) by mouth 2 (two) times daily.    Dispense:  10 tablet    Refill:  0    Order Specific Question:   Supervising Provider    Answer:  JEGEDE, OLUGBEMIGA E [id number]  . metroNIDAZOLE (FLAGYL) 500 MG tablet    Sig: Take 1 tablet (500 mg total) by mouth 2 (two) times daily.    Dispense:  14 tablet    Refill:  0    Order Specific Question:   Supervising Provider    Answer:   Tresa Garter [id number]  . varenicline (CHANTIX CONTINUING MONTH PAK) 1 MG tablet    Sig: Take 1 tablet (1 mg total) by mouth 2 (two) times daily.    Dispense:  60 tablet    Refill:  3    Order Specific Question:   Supervising Provider    Answer:   Tresa Garter [id number]    Follow-up: Return if symptoms worsen or fail to improve.   Clent Demark PA

## 2016-12-30 NOTE — Patient Instructions (Signed)

## 2018-02-02 ENCOUNTER — Encounter: Payer: Self-pay | Admitting: *Deleted

## 2018-02-24 IMAGING — NM NM MISC PROCEDURE
9 series · 54 of 54 positions shown · non-contrast
Comparison: none

[Series 1: rest sax · 6.4mm · 6.40mm/px · 6 of 23 frames shown]
[frame 2/23]
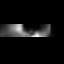
[frame 6/23]
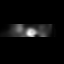
[frame 10/23]
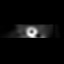
[frame 14/23]
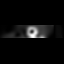
[frame 18/23]
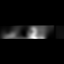
[frame 22/23]
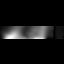

[Series 1: wbr_r-proj_st wbr rest · 6.40mm/px · 6 of 64 frames shown]
[frame 6/64]
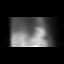
[frame 16/64]
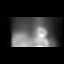
[frame 27/64]
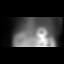
[frame 38/64]
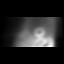
[frame 48/64]
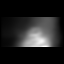
[frame 59/64]
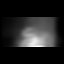

[Series 1: wbr rest · 6.40mm/px · 6 of 58 frames shown]
[frame 5/58  full-range]
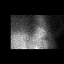
[frame 15/58  full-range]
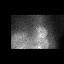
[frame 25/58  full-range]
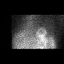
[frame 34/58  full-range]
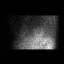
[frame 44/58  full-range]
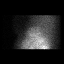
[frame 54/58  full-range]
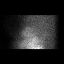

[Series 2: stress sax · 6.4mm · 6.40mm/px · 6 of 23 frames shown]
[frame 2/23]
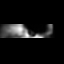
[frame 6/23]
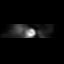
[frame 10/23]
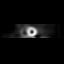
[frame 14/23]
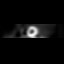
[frame 18/23]
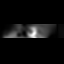
[frame 22/23]
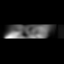

[Series 2: stress sax gs · 6.4mm · 6.40mm/px · 6 of 184 frames shown]
[frame 16/184]
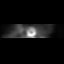
[frame 46/184]
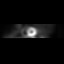
[frame 77/184]
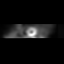
[frame 108/184]
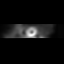
[frame 138/184]
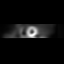
[frame 169/184]
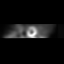

[Series 2: wbr stress-gsp · 6.40mm/px · 6 of 489 frames shown]
[frame 41/489  full-range]
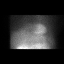
[frame 123/489  full-range]
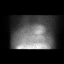
[frame 204/489  full-range]
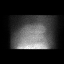
[frame 286/489  full-range]
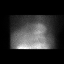
[frame 367/489  full-range]
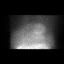
[frame 449/489  full-range]
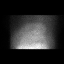

[Series 2: wbr_s-proj_st wbr stress-gsp · 6.40mm/px · 6 of 512 frames shown]
[frame 43/512]
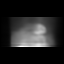
[frame 128/512]
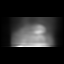
[frame 214/512]
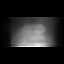
[frame 299/512]
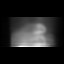
[frame 384/512]
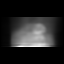
[frame 470/512]
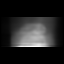

[Series 3: wbr stress-sum-em · 6.40mm/px · 6 of 64 frames shown]
[frame 6/64]
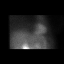
[frame 16/64]
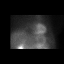
[frame 27/64]
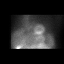
[frame 38/64]
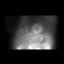
[frame 48/64]
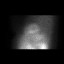
[frame 59/64]
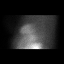

[Series 3: wbr_s-proj_st wbr stress-sum-em · 6.40mm/px · 6 of 64 frames shown]
[frame 6/64]
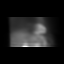
[frame 16/64]
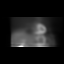
[frame 27/64]
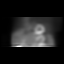
[frame 38/64]
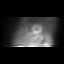
[frame 48/64]
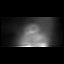
[frame 59/64]
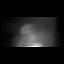

[54 of 54 positions shown; findings below may reference images not displayed]

Canned report from images found in remote index.

Refer to host system for actual result text.

## 2018-07-16 ENCOUNTER — Encounter: Payer: Self-pay | Admitting: *Deleted
